# Patient Record
Sex: Female | Born: 1990 | Race: Black or African American | Hispanic: No | Marital: Single | Smoking: Never smoker
Health system: Southern US, Community
[De-identification: ages and names within clinical notes are randomized; demographics above are authoritative.]

## PROBLEM LIST (undated history)

## (undated) DIAGNOSIS — J45909 Unspecified asthma, uncomplicated: Secondary | ICD-10-CM

## (undated) HISTORY — DX: Unspecified asthma, uncomplicated: J45.909

---

## 2011-10-25 ENCOUNTER — Emergency Department (HOSPITAL_COMMUNITY)
Admission: EM | Admit: 2011-10-25 | Discharge: 2011-10-25 | Disposition: A | Discharge status: Home / Self Care | Payer: Self-pay | Attending: Emergency Medicine | Admitting: Emergency Medicine

## 2011-10-25 ENCOUNTER — Encounter (HOSPITAL_COMMUNITY): Payer: Self-pay | Admitting: *Deleted

## 2011-10-25 DIAGNOSIS — H01009 Unspecified blepharitis unspecified eye, unspecified eyelid: Secondary | ICD-10-CM | POA: Insufficient documentation

## 2011-10-25 DIAGNOSIS — H01003 Unspecified blepharitis right eye, unspecified eyelid: Secondary | ICD-10-CM

## 2011-10-25 MED ORDER — TOBRAMYCIN 0.3 % OP OINT
TOPICAL_OINTMENT | Freq: Once | OPHTHALMIC | Status: AC
  Administered 2011-10-25: 13:00:00 via OPHTHALMIC
  Filled 2011-10-25: qty 3.5

## 2011-10-25 MED ORDER — DIPHENHYDRAMINE HCL 25 MG PO TABS: 25.0000 mg | ORAL_TABLET | Freq: Four times a day (QID) | ORAL | Status: DC | PRN

## 2011-10-25 NOTE — Discharge Instructions (Signed)
Blepharitis Blepharitis is redness, soreness, and swelling (inflammation) of one or both eyelids. It may be caused by an allergic reaction or a bacterial infection. Blepharitis may also be associated with reddened, scaly skin (seborrhea) of the scalp and eyebrows. While you sleep, eye discharge may cause your eyelashes to stick together. Your eyelids may itch, burn, swell, and may lose their lashes. These will grow back. Your eyes may become sensitive. Blepharitis may recur and need repeated treatment. If this is the case, you may require further evaluation by an eye specialist (ophthalmologist). HOME CARE INSTRUCTIONS   Keep your hands clean.   Use a clean towel each time you dry your eyelids. Do not use this towel to clean other areas. Do not share a towel or makeup with anyone.   Wash your eyelids with warm water or warm water mixed with a small amount of baby shampoo. Do this twice a day or as often as needed.   Wash your face and eyebrows at least once a day.   Use warm compresses 2 times a day for 10 minutes at a time, or as directed by your caregiver.   Apply antibiotic ointment as directed by your caregiver.   Avoid rubbing your eyes.   Avoid wearing makeup until you get better.   Follow up with your caregiver as directed.  SEEK IMMEDIATE MEDICAL CARE IF:   You have pain, redness, or swelling that gets worse or spreads to other parts of your face.   Your vision changes, or you have pain when looking at lights or moving objects.   You have a fever.   Your symptoms continue for longer than 2 to 4 days or become worse.  MAKE SURE YOU:   Understand these instructions.   Will watch your condition.   Will get help right away if you are not doing well or get worse.  Document Released: 05/21/2000 Document Revised: 05/13/2011 Document Reviewed: 07/01/2010 [employer] [facility] [employer] [facility] Patient Information 2012 [place], [place].   Apply the antibiotic ointment given to you to your right eyelid 3  times daily for the next 5 days.  Use Benadryl if needed for itching and [place] hard not to rub your on a which can worsen the swelling.  Please call the ophthalmologist listed above if you continue to have symptoms beyond the next 5 days, or if your symptoms worsen.

## 2011-10-25 NOTE — ED Notes (Signed)
Pt states she woke up this am and her rt eye was swollen states had some curry that she tried new a few days ago but otherwise has had nothing else new states that she had bad allergis reaction to something a while ago but did not got to  dr . Andrey Cota that she doesn,t feel like she  Has any Fb in eye at this time eye is not red

## 2011-10-25 NOTE — ED Notes (Signed)
Patient awoke this am at approx 1100 with right eye swelling, no injury, no drainage

## 2011-10-27 NOTE — ED Provider Notes (Signed)
History     CSN: [id number]  Arrival date & time 10/25/11  1156   First MD Initiated Contact with Patient 10/25/11 1235      Chief Complaint  Patient presents with  . Eye Problem    right eye swelling    (Consider location/radiation/quality/duration/timing/severity/associated sxs/prior treatment) HPI Comments: Patient states her eyes were itchy yesterday evening prior to bed,  Especially the right eye.  She rubbed her eyes vigorously and when she woke today,  She had swelling of the eyelids of her right eye.  She denies pain,  But states she some soreness along the outer upper lid margin.  Denies foreign body sensation or visual changes,  No thick drainage,  But has had increased tearing.  She denies injury and does not wear contacts or glasses.    Patient is a 21 y.o. female presenting with eye problem. The history is provided by the patient.  Eye Problem     History reviewed. No pertinent past medical history.  History reviewed. No pertinent past surgical history.  No family history on file.  History  Substance Use Topics  . Smoking status: Not on file  . Smokeless tobacco: Not on file  . Alcohol Use: No    OB History    Grav Para Term Preterm Abortions TAB SAB Ect Mult Living                  Review of Systems  Constitutional: Negative for fever.  HENT: Negative for sore throat, facial swelling, neck pain and neck stiffness.   Eyes: Positive for itching.  Respiratory: Negative for shortness of breath.     Allergies  Review of patient's allergies indicates no known allergies.  Home Medications   Current Outpatient Rx  Name Route Sig Dispense Refill  . DIPHENHYDRAMINE HCL 25 MG PO TABS Oral Take 1 tablet (25 mg total) by mouth every 6 (six) hours as needed for itching. 20 tablet 0    BP 126/69  Pulse 77  Temp(Src) 97.9 F (36.6 C) (Oral)  Resp 20  SpO2 100%  LMP 10/25/2011  Physical Exam  Constitutional: She is oriented to person, place, and time.  She appears well-developed and well-nourished. No distress.  HENT:  Head: Normocephalic and atraumatic.  Right Ear: Tympanic membrane and external ear normal.  Left Ear: Tympanic membrane and external ear normal.  Mouth/Throat: Uvula is midline, oropharynx is clear and moist and mucous membranes are normal. No oral lesions.  Eyes: Conjunctivae and EOM are normal. Pupils are equal, round, and reactive to light. Right eye exhibits no chemosis, no discharge and no exudate. No foreign body present in the right eye. Right conjunctiva is not injected.       Acuity completed at bedside os/ou/od 20/20.  Patient has right upper lid edema and slight erythema along lateral border with increased swelling laterally.  No obvious hordeolum/punctum.  Slight scaling along eyelash edges.    Neck: Normal range of motion. Neck supple.  Cardiovascular: Normal rate and normal heart sounds.   Pulmonary/Chest: Effort normal.  Abdominal: She exhibits no distension.  Musculoskeletal: Normal range of motion.  Lymphadenopathy:    She has no cervical adenopathy.  Neurological: She is alert and oriented to person, place, and time.  Skin: Skin is warm and dry. No erythema.  Psychiatric: She has a normal mood and affect.    ED Course  Procedures (including critical care time)  Labs Reviewed - No data to display No results found.  1. Blepharitis of right eye       MDM  Possible blepharitis vs allergic reaction with traumatic swelling secondary to rubbing the lids.  Pt encouraged to avoid rubbing her eyes,  Rather use cool compresses if itchy. Also suggested washing lids to eyelid margins twice daily with baby shampoo.  Benadryl for itching,  Given tobrex ointment for periorbital use tid x 1 week.  Referral to Dr. Gwen Pounds for a recheck if not improved over the next week or if sx worsen.         Burgess Amor, PA 10/27/11 1246  Burgess Amor, PA 10/27/11 1247  Burgess Amor, PA 10/27/11 1247

## 2011-10-28 NOTE — ED Provider Notes (Signed)
Medical screening examination/treatment/procedure(s) were performed by non-physician practitioner and as supervising physician I was immediately available for consultation/collaboration.   Zuley Lutter, MD 10/28/11 1336 

## 2012-06-07 NOTE — L&D Delivery Note (Signed)
Delivery Note Progressed to complete dilation and pushed well.  At 11:06 PM a viable and healthy female was delivered via Vaginal, Spontaneous Delivery en caul  (Presentation:OA with compound hand presentation ).  No difficulty with shoulders  APGAR:9/9;  weight .   Placenta status: spontaneous and grossly intact with 3V Cord:  with the following complications: short cord .   Anesthesia:  None  Episiotomy: None Lacerations: Bilateral labial splits,not bleeding, patient requested no repair, both hemostatic Suture Repair: none Est. Blood Loss (mL): 300  Mom to postpartum.  Baby to with mother.  [facility] 12/17/2012, 11:34 PM

## 2012-08-23 ENCOUNTER — Other Ambulatory Visit: Payer: Self-pay

## 2012-08-23 LAB — HIV ANTIBODY (ROUTINE TESTING W REFLEX): HIV: NONREACTIVE

## 2012-08-23 NOTE — Addendum Note (Signed)
Addended by: Jill Side on: 08/23/2012 09:50 AM   Modules accepted: Orders

## 2012-08-24 LAB — OBSTETRIC PANEL
Antibody Screen: NEGATIVE
Eosinophils Absolute: 0.1 10*3/uL (ref 0.0–0.7)
Eosinophils Relative: 1 % (ref 0–5)
HCT: 29.5 % — ABNORMAL LOW (ref 36.0–46.0)
Lymphs Abs: 1.3 10*3/uL (ref 0.7–4.0)
MCH: 29.5 pg (ref 26.0–34.0)
MCV: 84.5 fL (ref 78.0–100.0)
Monocytes Absolute: 0.6 10*3/uL (ref 0.1–1.0)
Platelets: 243 10*3/uL (ref 150–400)
RBC: 3.49 MIL/uL — ABNORMAL LOW (ref 3.87–5.11)
RDW: 14.8 % (ref 11.5–15.5)

## 2012-08-25 LAB — HEMOGLOBINOPATHY EVALUATION
Hgb A: 59 % — ABNORMAL LOW (ref 96.8–97.8)
Hgb S Quant: 0 %

## 2012-08-28 ENCOUNTER — Encounter: Payer: Self-pay | Admitting: Family Medicine

## 2012-08-28 DIAGNOSIS — D582 Other hemoglobinopathies: Secondary | ICD-10-CM | POA: Insufficient documentation

## 2012-08-28 LAB — HGB ELECTROPHORESIS REFLEXED REPORT
Hemoglobin A2 - HGBRFX: 3.1 % (ref 1.8–3.5)
Hemoglobin Elect C: 41.2 % — ABNORMAL HIGH
Hemoglobin F - HGBRFX: 0 % (ref <2.0)
Sickle Solubility Test - HGBRFX: NEGATIVE

## 2012-08-29 ENCOUNTER — Ambulatory Visit (HOSPITAL_COMMUNITY)
Admission: RE | Admit: 2012-08-29 | Discharge: 2012-08-29 | Disposition: A | Discharge status: Home / Self Care | Payer: Medicaid Other | Source: Ambulatory Visit | Attending: Family Medicine | Admitting: Family Medicine

## 2012-08-29 ENCOUNTER — Encounter (HOSPITAL_COMMUNITY): Payer: Self-pay

## 2012-08-29 DIAGNOSIS — Z3689 Encounter for other specified antenatal screening: Secondary | ICD-10-CM | POA: Insufficient documentation

## 2012-08-29 DIAGNOSIS — Z3490 Encounter for supervision of normal pregnancy, unspecified, unspecified trimester: Secondary | ICD-10-CM

## 2012-08-29 DIAGNOSIS — O0930 Supervision of pregnancy with insufficient antenatal care, unspecified trimester: Secondary | ICD-10-CM | POA: Insufficient documentation

## 2012-08-30 ENCOUNTER — Encounter: Payer: Self-pay | Admitting: *Deleted

## 2012-08-30 ENCOUNTER — Telehealth: Payer: Self-pay | Admitting: *Deleted

## 2012-08-30 NOTE — Telephone Encounter (Signed)
Pt called nurse callback line again and stated what is below. Called pt and pt wanted to know if she would see the provider when before her appt.  I informed pt that the reason why we have sheduled an [place] before appt so that when she does see the provider on her appt time that the provider will know how to direct care.  I informed her that she would receive a new ob packet that gives her important pregnancy info.  Pt stated understanding.  Pt also wanted [place] to fax a proof of pregnancy to [facility] @ [phone] so that she could get WIC.  I let pt know that we would send proof of pregnancy to [place] to day.  Pt stated "ok" and did not have any other questions.

## 2012-08-30 NOTE — Telephone Encounter (Signed)
Pt left message stating that she had [place] and has questions.  I called pt and left message that I am calling to answer her questions. Please call back and leave new message stating her questions and whether or not it is ok to leave detailed responses on her voice mail.

## 2012-09-27 ENCOUNTER — Other Ambulatory Visit (HOSPITAL_COMMUNITY)
Admission: RE | Admit: 2012-09-27 | Discharge: 2012-09-27 | Disposition: A | Discharge status: Home / Self Care | Payer: Medicaid Other | Source: Ambulatory Visit | Attending: Obstetrics & Gynecology | Admitting: Obstetrics & Gynecology

## 2012-09-27 ENCOUNTER — Encounter: Payer: Self-pay | Admitting: Advanced Practice Midwife

## 2012-09-27 ENCOUNTER — Other Ambulatory Visit: Payer: Self-pay | Admitting: Obstetrics & Gynecology

## 2012-09-27 ENCOUNTER — Ambulatory Visit (INDEPENDENT_AMBULATORY_CARE_PROVIDER_SITE_OTHER): Payer: Medicaid Other | Admitting: Advanced Practice Midwife

## 2012-09-27 VITALS — BP 117/73 | Temp 97.0°F | Ht 60.0 in | Wt 130.2 lb

## 2012-09-27 DIAGNOSIS — D573 Sickle-cell trait: Secondary | ICD-10-CM

## 2012-09-27 DIAGNOSIS — Z113 Encounter for screening for infections with a predominantly sexual mode of transmission: Secondary | ICD-10-CM | POA: Insufficient documentation

## 2012-09-27 DIAGNOSIS — Z01419 Encounter for gynecological examination (general) (routine) without abnormal findings: Secondary | ICD-10-CM | POA: Insufficient documentation

## 2012-09-27 DIAGNOSIS — Z3403 Encounter for supervision of normal first pregnancy, third trimester: Secondary | ICD-10-CM

## 2012-09-27 DIAGNOSIS — Z3400 Encounter for supervision of normal first pregnancy, unspecified trimester: Secondary | ICD-10-CM

## 2012-09-27 LAB — CBC
MCHC: 34.9 g/dL (ref 30.0–36.0)
Platelets: 213 10*3/uL (ref 150–400)
RDW: 15.6 % — ABNORMAL HIGH (ref 11.5–15.5)
WBC: 7.6 10*3/uL (ref 4.0–10.5)

## 2012-09-27 LAB — POCT URINALYSIS DIP (DEVICE)
Ketones, ur: NEGATIVE mg/dL
Nitrite: NEGATIVE
Protein, ur: NEGATIVE mg/dL
Urobilinogen, UA: 0.2 mg/dL (ref 0.0–1.0)
pH: 6.5 (ref 5.0–8.0)

## 2012-09-27 NOTE — Progress Notes (Signed)
I saw and examined patient along with student and agree with above note.   Nancy Moran 09/27/2012 10:42 AM

## 2012-09-27 NOTE — Progress Notes (Signed)
   Subjective:    Nancy Moran is a G1P0 [redacted]w[redacted]d being seen today for her first obstetrical visit.  Her obstetrical history is significant for none. Patient does intend to breast feed. Pregnancy history fully reviewed.  Patient reports bump in rectal area, that is not painful or bleeding. States does not bother her to the point of having it checked at this time.Ceasar Mons Vitals:   09/27/12 4098 09/27/12 0838  BP: 117/73   Temp: 97 F (36.1 C)   Height:  5' (1.524 m)  Weight: 130 lb 3.2 oz (59.058 kg)     HISTORY: OB History   Grav Para Term Preterm Abortions TAB SAB Ect Mult Living   1              # Outc Date GA Lbr Len/2nd Wgt Sex Del Anes PTL Lv   1 CUR              Past Medical History  Diagnosis Date  . Asthma    History reviewed. No pertinent past surgical history. Family History  Problem Relation Age of Onset  . Hypertension Mother   . Hypertension Sister      Exam    Uterus:     Pelvic Exam:    Perineum: Normal Perineum   Vulva: normal   Vagina:  normal mucosa   pH:    Cervix: no cervical motion tenderness and no lesions   Adnexa: not evaluated   Bony Pelvis: average  System: Breast:  not examined   Skin: normal coloration and turgor, no rashes    Neurologic: normal   Extremities: no deformities, no musculoskeletal defects noted   HEENT extra ocular movement intact and sclera clear, anicteric   Mouth/Teeth mucous membranes moist, pharynx normal without lesions   Neck supple   Cardiovascular: regular rate and rhythm   Respiratory:  appears well, vitals normal, no respiratory distress, acyanotic, normal RR, chest clear, no wheezing, crepitations, rhonchi, normal symmetric air entry   Abdomen: soft, non-tender; bowel sounds normal; no masses,  no organomegaly   Urinary: bladder not palpable      Assessment:    Pregnancy: G1P0 Patient Active Problem List  Diagnosis  . Hemoglobin C trait        Plan:     Initial labs previously done. GC/Chlam  and PAP smear done. Prenatal vitamins. Problem list reviewed and updated. Genetic Screening previously done, quad screen negative  Ultrasound previously done with no abnormalities.  Follow up in 2 weeks. 50% of 40 min visit spent on counseling and coordination of care.     Nancy Moran 09/27/2012

## 2012-09-27 NOTE — Patient Instructions (Addendum)
Pregnancy - Third Trimester  The third trimester of pregnancy (the last 3 months) is a period of the most rapid growth for you and your baby. The baby approaches a length of 20 inches and a weight of 6 to 10 pounds. The baby is adding on fat and getting ready for life outside your body. While inside, babies have periods of sleeping and waking, suck their thumbs, and hiccups. You can often feel small contractions of the uterus. This is false labor. It is also called Braxton-Hicks contractions. This is like a practice for labor. The usual problems in this stage of pregnancy include more difficulty breathing, swelling of the hands and feet from water retention, and having to urinate more often because of the uterus and baby pressing on your bladder.   PRENATAL EXAMS  · Blood work may continue to be done during prenatal exams. These tests are done to check on your health and the probable health of your baby. Blood work is used to follow your blood levels (hemoglobin). Anemia (low hemoglobin) is common during pregnancy. Iron and vitamins are given to help prevent this. You may also continue to be checked for diabetes. Some of the past blood tests may be done again.  · The size of the uterus is measured during each visit. This makes sure your baby is growing properly according to your pregnancy dates.  · Your blood pressure is checked every prenatal visit. This is to make sure you are not getting toxemia.  · Your urine is checked every prenatal visit for infection, diabetes and protein.  · Your weight is checked at each visit. This is done to make sure gains are happening at the suggested rate and that you and your baby are growing normally.  · Sometimes, an ultrasound is performed to confirm the position and the proper growth and development of the baby. This is a test done that bounces harmless sound waves off the baby so your caregiver can more accurately determine due dates.  · Discuss the type of pain medication and  anesthesia you will have during your labor and delivery.  · Discuss the possibility and anesthesia if a Cesarean Section might be necessary.  · Inform your caregiver if there is any mental or physical violence at home.  Sometimes, a specialized non-stress test, contraction stress test and biophysical profile are done to make sure the baby is not having a problem. Checking the amniotic fluid surrounding the baby is called an amniocentesis. The amniotic fluid is removed by sticking a needle into the belly (abdomen). This is sometimes done near the end of pregnancy if an early delivery is required. In this case, it is done to help make sure the baby's lungs are mature enough for the baby to live outside of the womb. If the lungs are not mature and it is unsafe to deliver the baby, an injection of cortisone medication is given to the mother 1 to 2 days before the delivery. This helps the baby's lungs mature and makes it safer to deliver the baby.  CHANGES OCCURING IN THE THIRD TRIMESTER OF PREGNANCY  Your body goes through many changes during pregnancy. They vary from person to person. Talk to your caregiver about changes you notice and are concerned about.  · During the last trimester, you have probably had an increase in your appetite. It is normal to have cravings for certain foods. This varies from person to person and pregnancy to pregnancy.  · You may begin to   get stretch marks on your hips, abdomen, and breasts. These are normal changes in the body during pregnancy. There are no exercises or medications to take which prevent this change.  · Constipation may be treated with a stool softener or adding bulk to your diet. Drinking lots of fluids, fiber in vegetables, fruits, and whole grains are helpful.  · Exercising is also helpful. If you have been very active up until your pregnancy, most of these activities can be continued during your pregnancy. If you have been less active, it is helpful to start an exercise  program such as walking. Consult your caregiver before starting exercise programs.  · Avoid all smoking, alcohol, un-prescribed drugs, herbs and "street drugs" during your pregnancy. These chemicals affect the formation and growth of the baby. Avoid chemicals throughout the pregnancy to ensure the delivery of a healthy infant.  · Backache, varicose veins and hemorrhoids may develop or get worse.  · You will tire more easily in the third trimester, which is normal.  · The baby's movements may be stronger and more often.  · You may become short of breath easily.  · Your belly button may stick out.  · A yellow discharge may leak from your breasts called colostrum.  · You may have a bloody mucus discharge. This usually occurs a few days to a week before labor begins.  HOME CARE INSTRUCTIONS   · Keep your caregiver's appointments. Follow your caregiver's instructions regarding medication use, exercise, and diet.  · During pregnancy, you are providing food for you and your baby. Continue to eat regular, well-balanced meals. Choose foods such as meat, fish, milk and other low fat dairy products, vegetables, fruits, and whole-grain breads and cereals. Your caregiver will tell you of the ideal weight gain.  · A physical sexual relationship may be continued throughout pregnancy if there are no other problems such as early (premature) leaking of amniotic fluid from the membranes, vaginal bleeding, or belly (abdominal) pain.  · Exercise regularly if there are no restrictions. Check with your caregiver if you are unsure of the safety of your exercises. Greater weight gain will occur in the last 2 trimesters of pregnancy. Exercising helps:  · Control your weight.  · Get you in shape for labor and delivery.  · You lose weight after you deliver.  · Rest a lot with legs elevated, or as needed for leg cramps or low back pain.  · Wear a good support or jogging bra for breast tenderness during pregnancy. This may help if worn during  sleep. Pads or tissues may be used in the bra if you are leaking colostrum.  · Do not use hot tubs, steam rooms, or saunas.  · Wear your seat belt when driving. This protects you and your baby if you are in an accident.  · Avoid raw meat, cat litter boxes and soil used by cats. These carry germs that can cause birth defects in the baby.  · It is easier to loose urine during pregnancy. Tightening up and strengthening the pelvic muscles will help with this problem. You can practice stopping your urination while you are going to the bathroom. These are the same muscles you need to strengthen. It is also the muscles you would use if you were trying to stop from passing gas. You can practice tightening these muscles up 10 times a set and repeating this about 3 times per day. Once you know what muscles to tighten up, do not perform these   exercises during urination. It is more likely to cause an infection by backing up the urine.  · Ask for help if you have financial, counseling or nutritional needs during pregnancy. Your caregiver will be able to offer counseling for these needs as well as refer you for other special needs.  · Make a list of emergency phone numbers and have them available.  · Plan on getting help from family or friends when you go home from the hospital.  · Make a trial run to the hospital.  · Take prenatal classes with the father to understand, practice and ask questions about the labor and delivery.  · Prepare the baby's room/nursery.  · Do not travel out of the city unless it is absolutely necessary and with the advice of your caregiver.  · Wear only low or no heal shoes to have better balance and prevent falling.  MEDICATIONS AND DRUG USE IN PREGNANCY  · Take prenatal vitamins as directed. The vitamin should contain 1 milligram of folic acid. Keep all vitamins out of reach of children. Only a couple vitamins or tablets containing iron may be fatal to a baby or young child when ingested.  · Avoid use  of all medications, including herbs, over-the-counter medications, not prescribed or suggested by your caregiver. Only take over-the-counter or prescription medicines for pain, discomfort, or fever as directed by your caregiver. Do not use aspirin, ibuprofen (Motrin®, Advil®, Nuprin®) or naproxen (Aleve®) unless OK'd by your caregiver.  · Let your caregiver also know about herbs you may be using.  · Alcohol is related to a number of birth defects. This includes fetal alcohol syndrome. All alcohol, in any form, should be avoided completely. Smoking will cause low birth rate and premature babies.  · Street/illegal drugs are very harmful to the baby. They are absolutely forbidden. A baby born to an addicted mother will be addicted at birth. The baby will go through the same withdrawal an adult does.  SEEK MEDICAL CARE IF:  You have any concerns or worries during your pregnancy. It is better to call with your questions if you feel they cannot wait, rather than worry about them.  DECISIONS ABOUT CIRCUMCISION  You may or may not know the sex of your baby. If you know your baby is a boy, it may be time to think about circumcision. Circumcision is the removal of the foreskin of the penis. This is the skin that covers the sensitive end of the penis. There is no proven medical need for this. Often this decision is made on what is popular at the time or based upon religious beliefs and social issues. You can discuss these issues with your caregiver or pediatrician.  SEEK IMMEDIATE MEDICAL CARE IF:   · An unexplained oral temperature above 102° F (38.9° C) develops, or as your caregiver suggests.  · You have leaking of fluid from the vagina (birth canal). If leaking membranes are suspected, take your temperature and tell your caregiver of this when you call.  · There is vaginal spotting, bleeding or passing clots. Tell your caregiver of the amount and how many pads are used.  · You develop a bad smelling vaginal discharge with  a change in the color from clear to white.  · You develop vomiting that lasts more than 24 hours.  · You develop chills or fever.  · You develop shortness of breath.  · You develop burning on urination.  · You loose more than 2 pounds of weight   or gain more than 2 pounds of weight or as suggested by your caregiver.  · You notice sudden swelling of your face, hands, and feet or legs.  · You develop belly (abdominal) pain. Round ligament discomfort is a common non-cancerous (benign) cause of abdominal pain in pregnancy. Your caregiver still must evaluate you.  · You develop a severe headache that does not go away.  · You develop visual problems, blurred or double vision.  · If you have not felt your baby move for more than 1 hour. If you think the baby is not moving as much as usual, eat something with sugar in it and lie down on your left side for an hour. The baby should move at least 4 to 5 times per hour. Call right away if your baby moves less than that.  · You fall, are in a car accident or any kind of trauma.  · There is mental or physical violence at home.  Document Released: 05/18/2001 Document Revised: 08/16/2011 Document Reviewed: 11/20/2008  [employer]® Patient Information ©2013 [employer].

## 2012-09-28 LAB — HIV ANTIBODY (ROUTINE TESTING W REFLEX): HIV: NONREACTIVE

## 2012-09-28 LAB — GLUCOSE TOLERANCE, 1 HOUR (50G) W/O FASTING: Glucose, 1 Hour GTT: 54 mg/dL — ABNORMAL LOW (ref 70–140)

## 2012-09-29 ENCOUNTER — Encounter: Payer: Self-pay | Admitting: Advanced Practice Midwife

## 2012-09-29 DIAGNOSIS — O0930 Supervision of pregnancy with insufficient antenatal care, unspecified trimester: Secondary | ICD-10-CM | POA: Insufficient documentation

## 2012-10-11 ENCOUNTER — Other Ambulatory Visit: Payer: Self-pay | Admitting: Family Medicine

## 2012-10-11 ENCOUNTER — Ambulatory Visit (INDEPENDENT_AMBULATORY_CARE_PROVIDER_SITE_OTHER): Payer: Medicaid Other | Admitting: Family Medicine

## 2012-10-11 VITALS — BP 117/79 | Wt 133.2 lb

## 2012-10-11 DIAGNOSIS — Z3403 Encounter for supervision of normal first pregnancy, third trimester: Secondary | ICD-10-CM

## 2012-10-11 DIAGNOSIS — R82998 Other abnormal findings in urine: Secondary | ICD-10-CM

## 2012-10-11 DIAGNOSIS — R8281 Pyuria: Secondary | ICD-10-CM

## 2012-10-11 DIAGNOSIS — Z3400 Encounter for supervision of normal first pregnancy, unspecified trimester: Secondary | ICD-10-CM

## 2012-10-11 LAB — POCT URINALYSIS DIP (DEVICE)
Protein, ur: 30 mg/dL — AB
Urobilinogen, UA: 2 mg/dL — ABNORMAL HIGH (ref 0.0–1.0)
pH: 7 (ref 5.0–8.0)

## 2012-10-11 NOTE — Progress Notes (Signed)
Pulse 111  

## 2012-10-11 NOTE — Patient Instructions (Addendum)
Normal Labor and Delivery Your caregiver must first be sure you are in labor. Signs of labor include:  You may pass what is called "the mucus plug" before labor begins. This is a small amount of blood stained mucus.  Regular uterine contractions.  The time between contractions get closer together.  The discomfort and pain gradually gets more intense.  Pains are mostly located in the back.  Pains get worse when walking.  The cervix (the opening of the uterus becomes thinner (begins to efface) and opens up (dilates). Once you are in labor and admitted into the hospital or care center, your caregiver will do the following:  A complete physical examination.  Check your vital signs (blood pressure, pulse, temperature and the fetal heart rate).  Do a vaginal examination (using a sterile glove and lubricant) to determine:  The position (presentation) of the baby (head [vertex] or buttock first).  The level (station) of the baby's head in the birth canal.  The effacement and dilatation of the cervix.  You may have your pubic hair shaved and be given an enema depending on your caregiver and the circumstance.  An electronic monitor is usually placed on your abdomen. The monitor follows the length and intensity of the contractions, as well as the baby's heart rate.  Usually, your caregiver will insert an IV in your arm with a bottle of sugar water. This is done as a precaution so that medications can be given to you quickly during labor or delivery. NORMAL LABOR AND DELIVERY IS DIVIDED UP INTO 3 STAGES: First Stage This is when regular contractions begin and the cervix begins to efface and dilate. This stage can last from 3 to 15 hours. The end of the first stage is when the cervix is 100% effaced and 10 centimeters dilated. Pain medications may be given by   Injection (morphine, demerol, etc.)  Regional anesthesia (spinal, caudal or epidural, anesthetics given in different locations of  the spine). Paracervical pain medication may be given, which is an injection of and anesthetic on each side of the cervix. A pregnant woman may request to have "Natural Childbirth" which is not to have any medications or anesthesia during her labor and delivery. Second Stage This is when the baby comes down through the birth canal (vagina) and is born. This can take 1 to 4 hours. As the baby's head comes down through the birth canal, you may feel like you are going to have a bowel movement. You will get the urge to bear down and push until the baby is delivered. As the baby's head is being delivered, the caregiver will decide if an episiotomy (a cut in the perineum and vagina area) is needed to prevent tearing of the tissue in this area. The episiotomy is sewn up after the delivery of the baby and placenta. Sometimes a mask with nitrous oxide is given for the mother to breath during the delivery of the baby to help if there is too much pain. The end of Stage 2 is when the baby is fully delivered. Then when the umbilical cord stops pulsating it is clamped and cut. Third Stage The third stage begins after the baby is completely delivered and ends after the placenta (afterbirth) is delivered. This usually takes 5 to 30 minutes. After the placenta is delivered, a medication is given either by intravenous or injection to help contract the uterus and prevent bleeding. The third stage is not painful and pain medication is usually not necessary.   If an episiotomy was done, it is repaired at this time. After the delivery, the mother is watched and monitored closely for 1 to 2 hours to make sure there is no postpartum bleeding (hemorrhage). If there is a lot of bleeding, medication is given to contract the uterus and stop the bleeding. Document Released: 03/02/2008 Document Revised: 08/16/2011 Document Reviewed: 03/02/2008 [employer] Patient Information 2013 [employer].   Pregnancy - Third Trimester The third  trimester of pregnancy (the last 3 months) is a period of the most rapid growth for you and your baby. The baby approaches a length of 20 inches and a weight of 6 to 10 pounds. The baby is adding on fat and getting ready for life outside your body. While inside, babies have periods of sleeping and waking, suck their thumbs, and hiccups. You can often feel small contractions of the uterus. This is false labor. It is also called Braxton-Hicks contractions. This is like a practice for labor. The usual problems in this stage of pregnancy include more difficulty breathing, swelling of the hands and feet from water retention, and having to urinate more often because of the uterus and baby pressing on your bladder.  PRENATAL EXAMS  Blood work may continue to be done during prenatal exams. These tests are done to check on your health and the probable health of your baby. Blood work is used to follow your blood levels (hemoglobin). Anemia (low hemoglobin) is common during pregnancy. Iron and vitamins are given to help prevent this. You may also continue to be checked for diabetes. Some of the past blood tests may be done again.  The size of the uterus is measured during each visit. This makes sure your baby is growing properly according to your pregnancy dates.  Your blood pressure is checked every prenatal visit. This is to make sure you are not getting toxemia.  Your urine is checked every prenatal visit for infection, diabetes and protein.  Your weight is checked at each visit. This is done to make sure gains are happening at the suggested rate and that you and your baby are growing normally.  Sometimes, an ultrasound is performed to confirm the position and the proper growth and development of the baby. This is a test done that bounces harmless sound waves off the baby so your caregiver can more accurately determine due dates.  Discuss the type of pain medication and anesthesia you will have during your  labor and delivery.  Discuss the possibility and anesthesia if a Cesarean Section might be necessary.  Inform your caregiver if there is any mental or physical violence at home. Sometimes, a specialized non-stress test, contraction stress test and biophysical profile are done to make sure the baby is not having a problem. Checking the amniotic fluid surrounding the baby is called an amniocentesis. The amniotic fluid is removed by sticking a needle into the belly (abdomen). This is sometimes done near the end of pregnancy if an early delivery is required. In this case, it is done to help make sure the baby's lungs are mature enough for the baby to live outside of the womb. If the lungs are not mature and it is unsafe to deliver the baby, an injection of cortisone medication is given to the mother 1 to 2 days before the delivery. This helps the baby's lungs mature and makes it safer to deliver the baby. CHANGES OCCURING IN THE THIRD TRIMESTER OF PREGNANCY Your body goes through many changes during pregnancy. They   vary from person to person. Talk to your caregiver about changes you notice and are concerned about.  During the last trimester, you have probably had an increase in your appetite. It is normal to have cravings for certain foods. This varies from person to person and pregnancy to pregnancy.  You may begin to get stretch marks on your hips, abdomen, and breasts. These are normal changes in the body during pregnancy. There are no exercises or medications to take which prevent this change.  Constipation may be treated with a stool softener or adding bulk to your diet. Drinking lots of fluids, fiber in vegetables, fruits, and whole grains are helpful.  Exercising is also helpful. If you have been very active up until your pregnancy, most of these activities can be continued during your pregnancy. If you have been less active, it is helpful to start an exercise program such as walking. Consult your  caregiver before starting exercise programs.  Avoid all smoking, alcohol, un-prescribed drugs, herbs and "street drugs" during your pregnancy. These chemicals affect the formation and growth of the baby. Avoid chemicals throughout the pregnancy to ensure the delivery of a healthy infant.  Backache, varicose veins and hemorrhoids may develop or get worse.  You will tire more easily in the third trimester, which is normal.  The baby's movements may be stronger and more often.  You may become short of breath easily.  Your belly button may stick out.  A yellow discharge may leak from your breasts called colostrum.  You may have a bloody mucus discharge. This usually occurs a few days to a week before labor begins. HOME CARE INSTRUCTIONS   Keep your caregiver's appointments. Follow your caregiver's instructions regarding medication use, exercise, and diet.  During pregnancy, you are providing food for you and your baby. Continue to eat regular, well-balanced meals. Choose foods such as meat, fish, milk and other low fat dairy products, vegetables, fruits, and whole-grain breads and cereals. Your caregiver will tell you of the ideal weight gain.  A physical sexual relationship may be continued throughout pregnancy if there are no other problems such as early (premature) leaking of amniotic fluid from the membranes, vaginal bleeding, or belly (abdominal) pain.  Exercise regularly if there are no restrictions. Check with your caregiver if you are unsure of the safety of your exercises. Greater weight gain will occur in the last 2 trimesters of pregnancy. Exercising helps:  Control your weight.  Get you in shape for labor and delivery.  You lose weight after you deliver.  Rest a lot with legs elevated, or as needed for leg cramps or low back pain.  Wear a good support or jogging bra for breast tenderness during pregnancy. This may help if worn during sleep. Pads or tissues may be used in  the bra if you are leaking colostrum.  Do not use hot tubs, steam rooms, or saunas.  Wear your seat belt when driving. This protects you and your baby if you are in an accident.  Avoid raw meat, cat litter boxes and soil used by cats. These carry germs that can cause birth defects in the baby.  It is easier to loose urine during pregnancy. Tightening up and strengthening the pelvic muscles will help with this problem. You can practice stopping your urination while you are going to the bathroom. These are the same muscles you need to strengthen. It is also the muscles you would use if you were trying to stop from passing gas.   You can practice tightening these muscles up 10 times a set and repeating this about 3 times per day. Once you know what muscles to tighten up, do not perform these exercises during urination. It is more likely to cause an infection by backing up the urine.  Ask for help if you have financial, counseling or nutritional needs during pregnancy. Your caregiver will be able to offer counseling for these needs as well as refer you for other special needs.  Make a list of emergency phone numbers and have them available.  Plan on getting help from family or friends when you go home from the hospital.  Make a trial run to the hospital.  Take prenatal classes with the father to understand, practice and ask questions about the labor and delivery.  Prepare the baby's room/nursery.  Do not travel out of the city unless it is absolutely necessary and with the advice of your caregiver.  Wear only low or no heal shoes to have better balance and prevent falling. MEDICATIONS AND DRUG USE IN PREGNANCY  Take prenatal vitamins as directed. The vitamin should contain 1 milligram of folic acid. Keep all vitamins out of reach of children. Only a couple vitamins or tablets containing iron may be fatal to a baby or young child when ingested.  Avoid use of all medications, including herbs,  over-the-counter medications, not prescribed or suggested by your caregiver. Only take over-the-counter or prescription medicines for pain, discomfort, or fever as directed by your caregiver. Do not use aspirin, ibuprofen (Motrin, Advil, Nuprin) or naproxen (Aleve) unless OK'd by your caregiver.  Let your caregiver also know about herbs you may be using.  Alcohol is related to a number of birth defects. This includes fetal alcohol syndrome. All alcohol, in any form, should be avoided completely. Smoking will cause low birth rate and premature babies.  Street/illegal drugs are very harmful to the baby. They are absolutely forbidden. A baby born to an addicted mother will be addicted at birth. The baby will go through the same withdrawal an adult does. SEEK MEDICAL CARE IF: You have any concerns or worries during your pregnancy. It is better to call with your questions if you feel they cannot wait, rather than worry about them. DECISIONS ABOUT CIRCUMCISION You may or may not know the sex of your baby. If you know your baby is a boy, it may be time to think about circumcision. Circumcision is the removal of the foreskin of the penis. This is the skin that covers the sensitive end of the penis. There is no proven medical need for this. Often this decision is made on what is popular at the time or based upon religious beliefs and social issues. You can discuss these issues with your caregiver or pediatrician. SEEK IMMEDIATE MEDICAL CARE IF:   An unexplained oral temperature above 102 F (38.9 C) develops, or as your caregiver suggests.  You have leaking of fluid from the vagina (birth canal). If leaking membranes are suspected, take your temperature and tell your caregiver of this when you call.  There is vaginal spotting, bleeding or passing clots. Tell your caregiver of the amount and how many pads are used.  You develop a bad smelling vaginal discharge with a change in the color from clear to  white.  You develop vomiting that lasts more than 24 hours.  You develop chills or fever.  You develop shortness of breath.  You develop burning on urination.  You loose more than 2   pounds of weight or gain more than 2 pounds of weight or as suggested by your caregiver.  You notice sudden swelling of your face, hands, and feet or legs.  You develop belly (abdominal) pain. Round ligament discomfort is a common non-cancerous (benign) cause of abdominal pain in pregnancy. Your caregiver still must evaluate you.  You develop a severe headache that does not go away.  You develop visual problems, blurred or double vision.  If you have not felt your baby move for more than 1 hour. If you think the baby is not moving as much as usual, eat something with sugar in it and lie down on your left side for an hour. The baby should move at least 4 to 5 times per hour. Call right away if your baby moves less than that.  You fall, are in a car accident or any kind of trauma.  There is mental or physical violence at home. Document Released: 05/18/2001 Document Revised: 08/16/2011 Document Reviewed: 11/20/2008 [employer] Patient Information 2013 [employer].  

## 2012-10-11 NOTE — Progress Notes (Signed)
No complaints. Occasional heartburn.

## 2012-10-12 LAB — CULTURE, OB URINE: Colony Count: NO GROWTH

## 2012-10-25 ENCOUNTER — Encounter: Payer: Self-pay | Admitting: Obstetrics & Gynecology

## 2012-10-25 ENCOUNTER — Ambulatory Visit (INDEPENDENT_AMBULATORY_CARE_PROVIDER_SITE_OTHER): Payer: Medicaid Other | Admitting: Obstetrics & Gynecology

## 2012-10-25 VITALS — BP 117/70 | Temp 97.1°F | Wt 134.6 lb

## 2012-10-25 DIAGNOSIS — Z3403 Encounter for supervision of normal first pregnancy, third trimester: Secondary | ICD-10-CM

## 2012-10-25 DIAGNOSIS — D582 Other hemoglobinopathies: Secondary | ICD-10-CM

## 2012-10-25 DIAGNOSIS — O0930 Supervision of pregnancy with insufficient antenatal care, unspecified trimester: Secondary | ICD-10-CM

## 2012-10-25 LAB — POCT URINALYSIS DIP (DEVICE)
Bilirubin Urine: NEGATIVE
Ketones, ur: NEGATIVE mg/dL
Nitrite: NEGATIVE
Protein, ur: 30 mg/dL — AB
pH: 7 (ref 5.0–8.0)

## 2012-10-25 NOTE — Progress Notes (Signed)
Routine visit. Good FM. No OB problems. "Nancy Moran". She plans to breast feed. She declines birth control.

## 2012-10-25 NOTE — Progress Notes (Signed)
Pulse: 103

## 2012-11-07 ENCOUNTER — Encounter: Payer: Self-pay | Admitting: *Deleted

## 2012-11-08 ENCOUNTER — Ambulatory Visit (INDEPENDENT_AMBULATORY_CARE_PROVIDER_SITE_OTHER): Payer: Medicaid Other | Admitting: Advanced Practice Midwife

## 2012-11-08 VITALS — BP 113/68 | Temp 97.4°F | Wt 135.0 lb

## 2012-11-08 DIAGNOSIS — D573 Sickle-cell trait: Secondary | ICD-10-CM

## 2012-11-08 DIAGNOSIS — O0930 Supervision of pregnancy with insufficient antenatal care, unspecified trimester: Secondary | ICD-10-CM

## 2012-11-08 DIAGNOSIS — Z3493 Encounter for supervision of normal pregnancy, unspecified, third trimester: Secondary | ICD-10-CM

## 2012-11-08 LAB — OB RESULTS CONSOLE GC/CHLAMYDIA
Chlamydia: NEGATIVE
Gonorrhea: NEGATIVE

## 2012-11-08 LAB — OB RESULTS CONSOLE GBS: GBS: NEGATIVE

## 2012-11-08 LAB — POCT URINALYSIS DIP (DEVICE)
Ketones, ur: NEGATIVE mg/dL
Protein, ur: NEGATIVE mg/dL
Specific Gravity, Urine: 1.015 (ref 1.005–1.030)
Urobilinogen, UA: 0.2 mg/dL (ref 0.0–1.0)
pH: 7 (ref 5.0–8.0)

## 2012-11-08 NOTE — Addendum Note (Signed)
Addended by: Franchot Mimes on: 11/08/2012 11:32 AM   Modules accepted: Orders

## 2012-11-08 NOTE — Progress Notes (Signed)
Pulse- 80 

## 2012-11-08 NOTE — Progress Notes (Signed)
No complaints today. She thinks she has a hemorrhoid, but it is not painful at this time. GBS collected today.

## 2012-11-08 NOTE — Addendum Note (Signed)
Addended by: Franchot Mimes on: 11/08/2012 12:24 PM   Modules accepted: Orders

## 2012-11-15 ENCOUNTER — Ambulatory Visit (INDEPENDENT_AMBULATORY_CARE_PROVIDER_SITE_OTHER): Payer: Medicaid Other | Admitting: Advanced Practice Midwife

## 2012-11-15 VITALS — BP 125/69 | Temp 97.8°F | Wt 136.8 lb

## 2012-11-15 DIAGNOSIS — O0930 Supervision of pregnancy with insufficient antenatal care, unspecified trimester: Secondary | ICD-10-CM

## 2012-11-15 DIAGNOSIS — O0933 Supervision of pregnancy with insufficient antenatal care, third trimester: Secondary | ICD-10-CM

## 2012-11-15 LAB — POCT URINALYSIS DIP (DEVICE)
Bilirubin Urine: NEGATIVE
Glucose, UA: NEGATIVE mg/dL
Ketones, ur: NEGATIVE mg/dL
Nitrite: NEGATIVE

## 2012-11-15 NOTE — Progress Notes (Signed)
P=91, 

## 2012-11-15 NOTE — Progress Notes (Signed)
No complaints. Feeling well. S<D today. Will scheduled ultrasound.

## 2012-11-16 ENCOUNTER — Ambulatory Visit (HOSPITAL_COMMUNITY)
Admission: RE | Admit: 2012-11-16 | Discharge: 2012-11-16 | Disposition: A | Discharge status: Home / Self Care | Payer: Medicaid Other | Source: Ambulatory Visit | Attending: Advanced Practice Midwife | Admitting: Advanced Practice Midwife

## 2012-11-16 DIAGNOSIS — Z3689 Encounter for other specified antenatal screening: Secondary | ICD-10-CM | POA: Insufficient documentation

## 2012-11-16 DIAGNOSIS — O365990 Maternal care for other known or suspected poor fetal growth, unspecified trimester, not applicable or unspecified: Secondary | ICD-10-CM | POA: Insufficient documentation

## 2012-11-16 DIAGNOSIS — O0933 Supervision of pregnancy with insufficient antenatal care, third trimester: Secondary | ICD-10-CM

## 2012-11-22 ENCOUNTER — Encounter: Payer: Self-pay | Admitting: Advanced Practice Midwife

## 2012-11-22 ENCOUNTER — Ambulatory Visit (INDEPENDENT_AMBULATORY_CARE_PROVIDER_SITE_OTHER): Payer: Medicaid Other | Admitting: Advanced Practice Midwife

## 2012-11-22 VITALS — BP 124/79 | Temp 97.2°F | Wt 139.0 lb

## 2012-11-22 DIAGNOSIS — O365930 Maternal care for other known or suspected poor fetal growth, third trimester, not applicable or unspecified: Secondary | ICD-10-CM | POA: Insufficient documentation

## 2012-11-22 DIAGNOSIS — O365990 Maternal care for other known or suspected poor fetal growth, unspecified trimester, not applicable or unspecified: Secondary | ICD-10-CM

## 2012-11-22 LAB — POCT URINALYSIS DIP (DEVICE)
Bilirubin Urine: NEGATIVE
Glucose, UA: NEGATIVE mg/dL
Ketones, ur: NEGATIVE mg/dL
Specific Gravity, Urine: 1.025 (ref 1.005–1.030)

## 2012-11-22 NOTE — Progress Notes (Signed)
[place] reviewed. Growth was 23%ile with AC at 8%ile. They state this may represent early IUGR. NST done today and it was reactive. Good accels and variability. GBS negative. Labor precautions reviewed.

## 2012-11-22 NOTE — Progress Notes (Signed)
Pulse: 85

## 2012-11-22 NOTE — Patient Instructions (Signed)
Vaginal Delivery  Your caregiver must first be sure you are in labor. Signs of labor include:   You may pass what is called "the mucus plug" before labor begins. This is a small amount of blood stained mucus.   Regular uterine contractions.   The time between contractions get closer together.   The discomfort and pain gradually gets more intense.   Pains are mostly located in the back.   Pains get worse when walking.   The cervix (the opening of the uterus) becomes thinner (begins to efface) and opens up (dilates).  Once you are in labor and admitted into the hospital or care center, your caregiver will do the following:   A complete physical examination.   Check your vital signs (blood pressure, pulse, temperature and the fetal heart rate).   Do a vaginal examination (using a sterile glove and lubricant) to determine:   The position (presentation) of the baby (head [vertex] or buttock first).   The level (station) of the baby's head in the birth canal.   The effacement and dilatation of the cervix.   You may have your pubic hair shaved and be given an enema depending on your caregiver and the circumstance.   An electronic monitor is usually placed on your abdomen. The monitor follows the length and intensity of the contractions, as well as the baby's heart rate.   Usually, your caregiver will insert an IV in your arm with a bottle of sugar water. This is done as a precaution so that medications can be given to you quickly during labor or delivery.  NORMAL LABOR AND DELIVERY IS DIVIDED UP INTO 3 STAGES:  First Stage  This is when regular contractions begin and the cervix begins to efface and dilate. This stage can last from 3 to 15 hours. The end of the first stage is when the cervix is 100% effaced and 10 centimeters dilated. Pain medications may be given by    Injection (morphine, demerol, etc.)    Regional anesthesia (spinal, caudal or epidural, anesthetics given in different locations of the spine). Paracervical pain medication may be given, which is an injection of and anesthetic on each side of the cervix.  A pregnant woman may request to have "Natural Childbirth" which is not to have any medications or anesthesia during her labor and delivery.  Second Stage  This is when the baby comes down through the birth canal (vagina) and is born. This can take 1 to 4 hours. As the baby's head comes down through the birth canal, you may feel like you are going to have a bowel movement. You will get the urge to bear down and push until the baby is delivered. As the baby's head is being delivered, the caregiver will decide if an episiotomy (a cut in the perineum and vagina area) is needed to prevent tearing of the tissue in this area. The episiotomy is sewn up after the delivery of the baby and placenta. Sometimes a mask with nitrous oxide is given for the mother to breath during the delivery of the baby to help if there is too much pain. The end of Stage 2 is when the baby is fully delivered. Then when the umbilical cord stops pulsating it is clamped and cut.  Third Stage  The third stage begins after the baby is completely delivered and ends after the placenta (afterbirth) is delivered. This usually takes 5 to 30 minutes. After the placenta is delivered, a medication is given   either by intravenous or injection to help contract the uterus and prevent bleeding. The third stage is not painful and pain medication is usually not necessary. If an episiotomy was done, it is repaired at this time.  After the delivery, the mother is watched and monitored closely for 1 to 2 hours to make sure there is no postpartum bleeding (hemorrhage). If there is a lot of bleeding, medication is given to contract the uterus and stop the bleeding.  Document Released: 03/02/2008 Document Revised: 02/16/2012 Document Reviewed: 03/02/2008   [employer] Patient Information 2014 [employer].

## 2012-11-24 ENCOUNTER — Telehealth: Payer: Self-pay | Admitting: *Deleted

## 2012-11-24 NOTE — Telephone Encounter (Signed)
Ajola left a message that she is calling about a question about a procedue (nst) she had at her last appointment 6/18 and does she need to start coming twice a week for that - please call

## 2012-11-27 NOTE — Telephone Encounter (Signed)
Called patient back and told her I received her message asking about the NST and that the NST she had on the 18th was just a one time thing and that I spoke to Nancy Moran after her appt and Nancy Moran was not planning on twice weekly testing. Patient verbalized understanding and asked how the baby did on the test. I told her baby did great and the test was "reactive or normal". Patient vebalized understanding and had no further questions

## 2012-11-29 ENCOUNTER — Ambulatory Visit (INDEPENDENT_AMBULATORY_CARE_PROVIDER_SITE_OTHER): Payer: Medicaid Other | Admitting: Advanced Practice Midwife

## 2012-11-29 VITALS — BP 125/72 | Temp 97.6°F | Wt 139.0 lb

## 2012-11-29 DIAGNOSIS — O0933 Supervision of pregnancy with insufficient antenatal care, third trimester: Secondary | ICD-10-CM

## 2012-11-29 DIAGNOSIS — O0930 Supervision of pregnancy with insufficient antenatal care, unspecified trimester: Secondary | ICD-10-CM

## 2012-11-29 DIAGNOSIS — D582 Other hemoglobinopathies: Secondary | ICD-10-CM

## 2012-11-29 LAB — POCT URINALYSIS DIP (DEVICE)
Bilirubin Urine: NEGATIVE
Glucose, UA: NEGATIVE mg/dL
Ketones, ur: NEGATIVE mg/dL
Specific Gravity, Urine: 1.01 (ref 1.005–1.030)
Urobilinogen, UA: 0.2 mg/dL (ref 0.0–1.0)

## 2012-11-29 NOTE — Progress Notes (Signed)
No complaints, feeling well.

## 2012-11-29 NOTE — Progress Notes (Signed)
Pulse- 80 

## 2012-12-07 ENCOUNTER — Ambulatory Visit (INDEPENDENT_AMBULATORY_CARE_PROVIDER_SITE_OTHER): Payer: Medicaid Other | Admitting: Obstetrics & Gynecology

## 2012-12-07 VITALS — BP 117/77 | Temp 97.0°F | Wt 144.6 lb

## 2012-12-07 DIAGNOSIS — O365990 Maternal care for other known or suspected poor fetal growth, unspecified trimester, not applicable or unspecified: Secondary | ICD-10-CM

## 2012-12-07 DIAGNOSIS — O365930 Maternal care for other known or suspected poor fetal growth, third trimester, not applicable or unspecified: Secondary | ICD-10-CM

## 2012-12-07 LAB — POCT URINALYSIS DIP (DEVICE)
Bilirubin Urine: NEGATIVE
Glucose, UA: NEGATIVE mg/dL
Hgb urine dipstick: NEGATIVE
Ketones, ur: NEGATIVE mg/dL
Nitrite: NEGATIVE

## 2012-12-07 NOTE — Patient Instructions (Signed)
Vaginal Delivery  Your caregiver must first be sure you are in labor. Signs of labor include:   You may pass what is called "the mucus plug" before labor begins. This is a small amount of blood stained mucus.   Regular uterine contractions.   The time between contractions get closer together.   The discomfort and pain gradually gets more intense.   Pains are mostly located in the back.   Pains get worse when walking.   The cervix (the opening of the uterus) becomes thinner (begins to efface) and opens up (dilates).  Once you are in labor and admitted into the hospital or care center, your caregiver will do the following:   A complete physical examination.   Check your vital signs (blood pressure, pulse, temperature and the fetal heart rate).   Do a vaginal examination (using a sterile glove and lubricant) to determine:   The position (presentation) of the baby (head [vertex] or buttock first).   The level (station) of the baby's head in the birth canal.   The effacement and dilatation of the cervix.   You may have your pubic hair shaved and be given an enema depending on your caregiver and the circumstance.   An electronic monitor is usually placed on your abdomen. The monitor follows the length and intensity of the contractions, as well as the baby's heart rate.   Usually, your caregiver will insert an IV in your arm with a bottle of sugar water. This is done as a precaution so that medications can be given to you quickly during labor or delivery.  NORMAL LABOR AND DELIVERY IS DIVIDED UP INTO 3 STAGES:  First Stage  This is when regular contractions begin and the cervix begins to efface and dilate. This stage can last from 3 to 15 hours. The end of the first stage is when the cervix is 100% effaced and 10 centimeters dilated. Pain medications may be given by    Injection (morphine, demerol, etc.)    Regional anesthesia (spinal, caudal or epidural, anesthetics given in different locations of the spine). Paracervical pain medication may be given, which is an injection of and anesthetic on each side of the cervix.  A pregnant woman may request to have "Natural Childbirth" which is not to have any medications or anesthesia during her labor and delivery.  Second Stage  This is when the baby comes down through the birth canal (vagina) and is born. This can take 1 to 4 hours. As the baby's head comes down through the birth canal, you may feel like you are going to have a bowel movement. You will get the urge to bear down and push until the baby is delivered. As the baby's head is being delivered, the caregiver will decide if an episiotomy (a cut in the perineum and vagina area) is needed to prevent tearing of the tissue in this area. The episiotomy is sewn up after the delivery of the baby and placenta. Sometimes a mask with nitrous oxide is given for the mother to breath during the delivery of the baby to help if there is too much pain. The end of Stage 2 is when the baby is fully delivered. Then when the umbilical cord stops pulsating it is clamped and cut.  Third Stage  The third stage begins after the baby is completely delivered and ends after the placenta (afterbirth) is delivered. This usually takes 5 to 30 minutes. After the placenta is delivered, a medication is given   either by intravenous or injection to help contract the uterus and prevent bleeding. The third stage is not painful and pain medication is usually not necessary. If an episiotomy was done, it is repaired at this time.  After the delivery, the mother is watched and monitored closely for 1 to 2 hours to make sure there is no postpartum bleeding (hemorrhage). If there is a lot of bleeding, medication is given to contract the uterus and stop the bleeding.  Document Released: 03/02/2008 Document Revised: 02/16/2012 Document Reviewed: 03/02/2008   [employer] Patient Information 2014 [employer].

## 2012-12-07 NOTE — Progress Notes (Signed)
Pulse- 73 Patient reports pelvic pressure and some contractions

## 2012-12-07 NOTE — Progress Notes (Signed)
Some sharp pains, no leaking or bleeding. Labor precautions

## 2012-12-10 LAB — CULTURE, OB URINE

## 2012-12-11 ENCOUNTER — Ambulatory Visit (INDEPENDENT_AMBULATORY_CARE_PROVIDER_SITE_OTHER): Payer: Medicaid Other | Admitting: *Deleted

## 2012-12-11 VITALS — BP 129/89

## 2012-12-11 DIAGNOSIS — O365990 Maternal care for other known or suspected poor fetal growth, unspecified trimester, not applicable or unspecified: Secondary | ICD-10-CM

## 2012-12-11 DIAGNOSIS — O365930 Maternal care for other known or suspected poor fetal growth, third trimester, not applicable or unspecified: Secondary | ICD-10-CM

## 2012-12-11 DIAGNOSIS — O365931 Maternal care for other known or suspected poor fetal growth, third trimester, fetus 1: Secondary | ICD-10-CM

## 2012-12-11 DIAGNOSIS — O480 Post-term pregnancy: Secondary | ICD-10-CM

## 2012-12-11 NOTE — Progress Notes (Signed)
P = 74  Pt prefers not to be induced @ 41 wks- will discuss w/MD on 7/10 @ prenatal visit.

## 2012-12-11 NOTE — Addendum Note (Signed)
Addended by: Jill Side on: 12/11/2012 04:52 PM   Modules accepted: Level of Service

## 2012-12-12 NOTE — Progress Notes (Signed)
NST performed today was reviewed and was found to be reactive. AFI normal 8. 3 cm. Continue recommended antenatal testing and prenatal care.

## 2012-12-14 ENCOUNTER — Encounter: Payer: Self-pay | Admitting: Family Medicine

## 2012-12-14 ENCOUNTER — Ambulatory Visit (INDEPENDENT_AMBULATORY_CARE_PROVIDER_SITE_OTHER): Payer: Medicaid Other | Admitting: Family Medicine

## 2012-12-14 VITALS — BP 130/81 | Temp 98.0°F | Wt 143.3 lb

## 2012-12-14 DIAGNOSIS — O0933 Supervision of pregnancy with insufficient antenatal care, third trimester: Secondary | ICD-10-CM

## 2012-12-14 DIAGNOSIS — O480 Post-term pregnancy: Secondary | ICD-10-CM

## 2012-12-14 DIAGNOSIS — O0930 Supervision of pregnancy with insufficient antenatal care, unspecified trimester: Secondary | ICD-10-CM

## 2012-12-14 LAB — POCT URINALYSIS DIP (DEVICE)
Bilirubin Urine: NEGATIVE
Glucose, UA: NEGATIVE mg/dL
Nitrite: NEGATIVE
Specific Gravity, Urine: 1.01 (ref 1.005–1.030)
Urobilinogen, UA: 1 mg/dL (ref 0.0–1.0)

## 2012-12-14 NOTE — Progress Notes (Signed)
Pulse- 68 Patient reports swelling in right lower leg, pelvic pressure & some contractions

## 2012-12-14 NOTE — Patient Instructions (Addendum)
Breastfeeding A change in hormones during your pregnancy causes growth of your breast tissue and an increase in number and size of milk ducts. The hormone prolactin allows proteins, sugars, and fats from your blood supply to make breast milk in your milk-producing glands. The hormone progesterone prevents breast milk from being released before the birth of your baby. After the birth of your baby, your progesterone level decreases allowing breast milk to be released. Thoughts of your baby, as well as his or her sucking or crying, can stimulate the release of milk from the milk-producing glands. Deciding to breastfeed (nurse) is one of the best choices you can make for you and your baby. The information that follows gives a brief review of the benefits, as well as other important skills to know about breastfeeding. BENEFITS OF BREASTFEEDING For your baby  The first milk (colostrum) helps your baby's digestive system function better.   There are antibodies in your milk that help your baby fight off infections.   Your baby has a lower incidence of asthma, allergies, and sudden infant death syndrome (SIDS).   The nutrients in breast milk are better for your baby than infant formulas.  Breast milk improves your baby's brain development.   Your baby will have less gas, colic, and constipation.  Your baby is less likely to develop other conditions, such as childhood obesity, asthma, or diabetes mellitus. For you  Breastfeeding helps develop a very special bond between you and your baby.   Breastfeeding is convenient, always available at the correct temperature, and costs nothing.   Breastfeeding helps to burn calories and helps you lose the weight gained during pregnancy.   Breastfeeding makes your uterus contract back down to normal size faster and slows bleeding following delivery.   Breastfeeding mothers have a lower risk of developing osteoporosis or breast or ovarian cancer later  in life.  BREASTFEEDING FREQUENCY  A healthy, full-term baby may breastfeed as often as every hour or space his or her feedings to every 3 hours. Breastfeeding frequency will vary from baby to baby.   Newborns should be fed no less than every 2 3 hours during the day and every 4 5 hours during the night. You should breastfeed a minimum of 8 feedings in a 24 hour period.  Awaken your baby to breastfeed if it has been 3 4 hours since the last feeding.  Breastfeed when you feel the need to reduce the fullness of your breasts or when your newborn shows signs of hunger. Signs that your baby may be hungry include:  Increased alertness or activity.  Stretching.  Movement of the head from side to side.  Movement of the head and opening of the mouth when the corner of the mouth or cheek is stroked (rooting).  Increased sucking sounds, smacking lips, cooing, sighing, or squeaking.  Hand-to-mouth movements.  Increased sucking of fingers or hands.  Fussing.  Intermittent crying.  Signs of extreme hunger will require calming and consoling before you try to feed your baby. Signs of extreme hunger may include:  Restlessness.  A loud, strong cry.  Screaming.  Frequent feeding will help you make more milk and will help prevent problems, such as sore nipples and engorgement of the breasts.  BREASTFEEDING   Whether lying down or sitting, be sure that the baby's abdomen is facing your abdomen.   Support your breast with 4 fingers under your breast and your thumb above your nipple. Make sure your fingers are well away from  your nipple and your baby's mouth.   Stroke your baby's lips gently with your finger or nipple.   When your baby's mouth is open wide enough, place all of your nipple and as much of the colored area around your nipple (areola) as possible into your baby's mouth.  More areola should be visible above his or her upper lip than below his or her lower lip.  Your  baby's tongue should be between his or her lower gum and your breast.  Ensure that your baby's mouth is correctly positioned around the nipple (latched). Your baby's lips should create a seal on your breast.  Signs that your baby has effectively latched onto your nipple include:  Tugging or sucking without pain.  Swallowing heard between sucks.  Absent click or smacking sound.  Muscle movement above and in front of his or her ears with sucking.  Your baby must suck about 2 3 minutes in order to get your milk. Allow your baby to feed on each breast as [place] as he or she wants. Nurse your baby until he or she unlatches or falls asleep at the first breast, then offer the second breast.  Signs that your baby is full and satisfied include:  A gradual decrease in the number of sucks or complete cessation of sucking.  Falling asleep.  Extension or relaxation of his or her body.  Retention of a small amount of milk in his or her mouth.  Letting go of your breast by himself or herself.  Signs of effective breastfeeding in you include:  Breasts that have increased firmness, weight, and size prior to feeding.  Breasts that are softer after nursing.  Increased milk volume, as well as a change in milk consistency and color by the 5th day of breastfeeding.  Breast fullness relieved by breastfeeding.  Nipples are not sore, cracked, or bleeding.  If needed, break the suction by putting your finger into the corner of your baby's mouth and sliding your finger between his or her gums. Then, remove your breast from his or her mouth.  It is common for babies to spit up a small amount after a feeding.  Babies often swallow air during feeding. This can make babies fussy. Burping your baby between breasts can help with this.  Vitamin D supplements are recommended for babies who get only breast milk.  Avoid using a pacifier during your baby's first 4 6 weeks.  Avoid supplemental feedings of  water, formula, or juice in place of breastfeeding. Breast milk is all the food your baby needs. It is not necessary for your baby to have water or formula. Your breasts will make more milk if supplemental feedings are avoided during the early weeks. HOW TO TELL WHETHER YOUR BABY IS GETTING ENOUGH BREAST MILK Wondering whether or not your baby is getting enough milk is a common concern among mothers. You can be assured that your baby is getting enough milk if:   Your baby is actively sucking and you hear swallowing.   Your baby seems relaxed and satisfied after a feeding.   Your baby nurses at least 8 12 times in a 24 hour time period.  During the first 22 28 days of age:  Your baby is wetting at least 3 5 diapers in a 24 hour period. The urine should be clear and pale yellow.  Your baby is having at least 3 4 stools in a 24 hour period. The stool should be soft and yellow.  At  35 50 days of age, your baby is having at least 3 6 stools in a 24 hour period. The stool should be seedy and yellow by 35 days of age.  Your baby has a weight loss less than 7 10% during the first 7 days of age.  Your baby does not lose weight after 55 19 days of age.  Your baby gains 4 7 ounces each week after he or she is 49 days of age.  Your baby gains weight by 34 days of age and is back to birth weight within 2 weeks. ENGORGEMENT In the first week after your baby is born, you may experience extremely full breasts (engorgement). When engorged, your breasts may feel heavy, warm, or tender to the touch. Engorgement peaks within 24 48 hours after delivery of your baby.  Engorgement may be reduced by:  Continuing to breastfeed.  Increasing the frequency of breastfeeding.  Taking warm showers or applying warm, moist heat to your breasts just before each feeding. This increases circulation and helps the milk flow.   Gently massaging your breast before and during the feedings. With your fingertips, massage from  your chest wall towards your nipple in a circular motion.   Ensuring that your baby empties at least one breast at every feeding. It also helps to start the next feeding on the opposite breast.   Expressing breast milk by hand or by using a breast pump to empty the breasts if your baby is sleepy, or not nursing well. You may also want to express milk if you are returning to work oryou feel you are getting engorged.  Ensuring your baby is latched on and positioned properly while breastfeeding. If you follow these suggestions, your engorgement should improve in 24 48 hours. If you are still experiencing difficulty, call your lactation consultant or caregiver.  CARING FOR YOURSELF Take care of your breasts.  Bathe or shower daily.   Avoid using soap on your nipples.   Wear a supportive bra. Avoid wearing underwire style bras.  Air dry your nipples for a 3 after each feeding.   Use only cotton bra pads to absorb breast milk leakage. Leaking of breast milk between feedings is normal.   Use only pure lanolin on your nipples after nursing. You do not need to wash it off before feeding your baby again. Another option is to express a few drops of breast milk and gently massage that milk into your nipples.  Continue breast self-awareness checks. Take care of yourself.  Eat healthy foods. Alternate 3 meals with 3 snacks.  Avoid foods that you notice affect your baby in a bad way.  Drink milk, fruit juice, and water to satisfy your thirst (about 8 glasses a day).   Rest often, relax, and take your prenatal vitamins to prevent fatigue, stress, and anemia.  Avoid chewing and smoking tobacco.  Avoid alcohol and drug use.  Take over-the-counter and prescribed medicine only as directed by your caregiver or pharmacist. You should always check with your caregiver or pharmacist before taking any new medicine, vitamin, or herbal supplement.  Know that pregnancy is possible while  breastfeeding. If desired, talk to your caregiver about family planning and safe birth control methods that may be used while breastfeeding. SEEK MEDICAL CARE IF:   You feel like you want to stop breastfeeding or have become frustrated with breastfeeding.  You have painful breasts or nipples.  Your nipples are cracked or bleeding.  Your breasts are red, tender,  or warm.  You have a swollen area on either breast.  You have a fever or chills.  You have nausea or vomiting.  You have drainage from your nipples.  Your breasts do not become full before feedings by the 5th day after delivery.  You feel sad and depressed.  Your baby is too sleepy to eat well.  Your baby is having trouble sleeping.   Your baby is wetting less than 3 diapers in a 24 hour period.  Your baby has less than 3 stools in a 24 hour period.  Your baby's skin or the white part of his or her eyes becomes more yellow.   Your baby is not gaining weight by 4 days of age. MAKE SURE YOU:   Understand these instructions.  Will watch your condition.  Will get help right away if you are not doing well or get worse. Document Released: 05/24/2005 Document Revised: 02/16/2012 Document Reviewed: 12/29/2011 [facility] Patient Information 2014 [place], [place]. Vaginal Delivery Your caregiver must first be sure you are in labor. Signs of labor include:  You may pass what is called "the mucus plug" before labor begins. This is a small amount of blood stained mucus.  Regular uterine contractions.  The time between contractions get closer together.  The discomfort and pain gradually gets more intense.  Pains are mostly located in the back.  Pains get worse when walking.  The cervix (the opening of the uterus) becomes thinner (begins to efface) and opens up (dilates). Once you are in labor and admitted into the hospital or care center, your caregiver will do the following:  A complete physical  examination.  Check your vital signs (blood pressure, pulse, temperature and the fetal heart rate).  Do a vaginal examination (using a sterile glove and lubricant) to determine:  The position (presentation) of the baby (head [vertex] or buttock first).  The level (station) of the baby's head in the birth canal.  The effacement and dilatation of the cervix.  You may have your pubic hair shaved and be given an enema depending on your caregiver and the circumstance.  An electronic monitor is usually placed on your abdomen. The monitor follows the length and intensity of the contractions, as well as the baby's heart rate.  Usually, your caregiver will insert an IV in your arm with a bottle of sugar water. This is done as a precaution so that medications can be given to you quickly during labor or delivery. NORMAL LABOR AND DELIVERY IS DIVIDED UP INTO 3 STAGES: First Stage This is when regular contractions begin and the cervix begins to efface and dilate. This stage can last from 3 to 15 hours. The end of the first stage is when the cervix is 100% effaced and 10 centimeters dilated. Pain medications may be given by   Injection (morphine, demerol, etc.)  Regional anesthesia (spinal, caudal or epidural, anesthetics given in different locations of the spine). Paracervical pain medication may be given, which is an injection of and anesthetic on each side of the cervix. A pregnant woman may request to have "Natural Childbirth" which is not to have any medications or anesthesia during her labor and delivery. Second Stage This is when the baby comes down through the birth canal (vagina) and is born. This can take 1 to 4 hours. As the baby's head comes down through the birth canal, you may feel like you are going to have a bowel movement. You will get the urge to bear down  and push until the baby is delivered. As the baby's head is being delivered, the caregiver will decide if an episiotomy (a cut in  the perineum and vagina area) is needed to prevent tearing of the tissue in this area. The episiotomy is sewn up after the delivery of the baby and placenta. Sometimes a mask with nitrous oxide is given for the mother to breath during the delivery of the baby to help if there is too much pain. The end of Stage 2 is when the baby is fully delivered. Then when the umbilical cord stops pulsating it is clamped and cut. Third Stage The third stage begins after the baby is completely delivered and ends after the placenta (afterbirth) is delivered. This usually takes 5 to 30 minutes. After the placenta is delivered, a medication is given either by intravenous or injection to help contract the uterus and prevent bleeding. The third stage is not painful and pain medication is usually not necessary. If an episiotomy was done, it is repaired at this time. After the delivery, the mother is watched and monitored closely for 1 to 2 hours to make sure there is no postpartum bleeding (hemorrhage). If there is a lot of bleeding, medication is given to contract the uterus and stop the bleeding. Document Released: 03/02/2008 Document Revised: 02/16/2012 Document Reviewed: 03/02/2008 [facility] Patient Information 2014 [place], [place].

## 2012-12-14 NOTE — Progress Notes (Signed)
NST reviewed and reactive. Post-dates contractions noted.  Does not want IOL--will schedule for her on Monday if undelivered for 41 6/7 wks

## 2012-12-17 ENCOUNTER — Inpatient Hospital Stay (HOSPITAL_COMMUNITY)
Admission: AD | Admit: 2012-12-17 | Discharge: 2012-12-19 | DRG: 775 | Disposition: A | Discharge status: Home / Self Care | Payer: Medicaid Other | Source: Ambulatory Visit | Attending: Obstetrics & Gynecology | Admitting: Obstetrics & Gynecology

## 2012-12-17 ENCOUNTER — Encounter (HOSPITAL_COMMUNITY): Payer: Self-pay | Admitting: *Deleted

## 2012-12-17 DIAGNOSIS — O365990 Maternal care for other known or suspected poor fetal growth, unspecified trimester, not applicable or unspecified: Principal | ICD-10-CM | POA: Diagnosis present

## 2012-12-17 DIAGNOSIS — O0933 Supervision of pregnancy with insufficient antenatal care, third trimester: Secondary | ICD-10-CM

## 2012-12-17 LAB — CBC
HCT: 35.2 % — ABNORMAL LOW (ref 36.0–46.0)
MCH: 30 pg (ref 26.0–34.0)
MCHC: 36.9 g/dL — ABNORMAL HIGH (ref 30.0–36.0)
MCV: 81.3 fL (ref 78.0–100.0)
RDW: 13.4 % (ref 11.5–15.5)

## 2012-12-17 LAB — ABO/RH: ABO/RH(D): A POS

## 2012-12-17 MED ORDER — LIDOCAINE HCL (PF) 1 % IJ SOLN
30.0000 mL | INTRAMUSCULAR | Status: DC | PRN
  Administered 2012-12-17: 30 mL via SUBCUTANEOUS
  Filled 2012-12-17 (×2): qty 30

## 2012-12-17 MED ORDER — OXYTOCIN BOLUS FROM INFUSION: 500.0000 mL | INTRAVENOUS | Status: DC

## 2012-12-17 MED ORDER — OXYTOCIN 40 UNITS IN LACTATED RINGERS INFUSION - SIMPLE MED: 62.5000 mL/h | INTRAVENOUS | Status: DC

## 2012-12-17 MED ORDER — OXYTOCIN 10 UNIT/ML IJ SOLN
INTRAMUSCULAR | Status: AC
  Filled 2012-12-17: qty 2

## 2012-12-17 MED ORDER — NALBUPHINE SYRINGE 5 MG/0.5 ML
10.0000 mg | INJECTION | INTRAMUSCULAR | Status: DC | PRN
  Filled 2012-12-17: qty 1

## 2012-12-17 MED ORDER — CITRIC ACID-SODIUM CITRATE 334-500 MG/5ML PO SOLN: 30.0000 mL | ORAL | Status: DC | PRN

## 2012-12-17 MED ORDER — LACTATED RINGERS IV SOLN
500.0000 mL | INTRAVENOUS | Status: DC | PRN
Start: 2012-12-17 — End: 2012-12-18

## 2012-12-17 MED ORDER — OXYCODONE-ACETAMINOPHEN 5-325 MG PO TABS: 1.0000 | ORAL_TABLET | ORAL | Status: DC | PRN

## 2012-12-17 MED ORDER — IBUPROFEN 600 MG PO TABS: 600.0000 mg | ORAL_TABLET | Freq: Four times a day (QID) | ORAL | Status: DC | PRN

## 2012-12-17 MED ORDER — ONDANSETRON HCL 4 MG/2ML IJ SOLN: 4.0000 mg | Freq: Four times a day (QID) | INTRAMUSCULAR | Status: DC | PRN

## 2012-12-17 MED ORDER — ACETAMINOPHEN 325 MG PO TABS: 650.0000 mg | ORAL_TABLET | ORAL | Status: DC | PRN

## 2012-12-17 MED ORDER — LACTATED RINGERS IV SOLN: INTRAVENOUS | Status: DC

## 2012-12-17 NOTE — Progress Notes (Signed)
Nancy Moran is a 22 y.o. G1P0 at [redacted]w[redacted]d by ultrasound admitted for active labor  Subjective: Feels pressure with contractions but refuses exam   Objective: BP 130/80  Pulse 76  Temp(Src) 97.8 F (36.6 C) (Oral)  Resp 18  Ht 5' (1.524 m)  Wt 64.864 kg (143 lb)  BMI 27.93 kg/m2  LMP 04/03/2012      FHT:  FHR: 145 bpm, variability: moderate,  accelerations:  Present,  decelerations:  Absent UC:   regular, every 3 minutes SVE:   Dilation: 6 Effacement (%): 80 Station: -2 Exam by:: A.Davis,RN  Labs: Lab Results  Component Value Date   WBC 8.5 12/17/2012   HGB 13.0 12/17/2012   HCT 35.2* 12/17/2012   MCV 81.3 12/17/2012   PLT 227 12/17/2012    Assessment / Plan: Spontaneous labor, progressing normally  Labor: Progressing normally Preeclampsia:  n/a Fetal Wellbeing:  Category I Pain Control:  Labor support without medications I/D:  n/a Anticipated MOD:  NSVD  Sirena Riddle 12/17/2012, 9:43 PM

## 2012-12-17 NOTE — Progress Notes (Signed)
Pt has order for IV but refuses, and refuses saline lock option. Dr. Thad Ranger aware of pt's refusal.

## 2012-12-17 NOTE — Progress Notes (Signed)
Pt involuntarily pushing, RN remains at bedside, encouraging pt to try hands and knees position

## 2012-12-17 NOTE — Progress Notes (Signed)
Patient ID: Nancy Moran, female   DOB: 1990-10-12, 22 y.o.   MRN: [id number]  Called for delivery by RN who stated she thought patient was complete.   Patient pushed a time or two and red blood was seen trickling  Cervix rechecked and found to be 8cm  Pt instructed to stop pushing and turned to hands/knees position for comfort  Will watch for second stage

## 2012-12-17 NOTE — H&P (Signed)
Nancy Moran is a 22 y.o. female presenting for onset of labor.  History - Pt reports contractions since 5am that increased in intensity and frequency around noon to q5-7 minutes. Denies vaginal bleeding, gush of fluid, or change in fetal movement.   ROS - Denies fevers, chills, vomiting, diarrhea, nausea, rash, vision change, headache, or rash.  OB History   Grav Para Term Preterm Abortions TAB SAB Ect Mult Living   1             Patient was seen in low risk clinic. Denies DM or HTN with this pregnancy. Toward the end was switched to high risk clinic but unsure why US - Fetal growth curve shows interval decline, with EFW at 23rd%ile and AC at 8%ile. This suggests possibility of developing asymmetric IUGR. AFI 15.86cm. No late developing fetal abnormalities seen involving visualized anatomy.  Past Medical History  Diagnosis Date  . Asthma    NKDA No surgeries Medications - prenatal vitamins  History reviewed. No pertinent past surgical history. Family History: family history includes Hypertension in her mother and sister. Social History:  reports that she has never smoked. She does not have any smokeless tobacco history on file. She reports that she does not drink alcohol or use illicit drugs. Denies tobacco, drugs, etoh.  Prenatal Transfer Tool  Maternal Diabetes: No (glucola 54) Genetic Screening: Normal  (quad neg) Maternal Ultrasounds/Referrals: Abnormal:  Findings:   Other: Possible evolving IUGR Fetal Ultrasounds or other Referrals:  None Maternal Substance Abuse:  No Significant Maternal Medications:  None Significant Maternal Lab Results:  Lab values include: Group B Strep negative Other Comments:  None  ROS - Per HPI  Dilation: 4 Effacement (%): 80 Station: -2 Exam by:: Danella Penton RN Vertex  Blood pressure 138/88, pulse 65, temperature 97.7 F (36.5 C), temperature source Oral, resp. rate 18, height 5' (1.524 m), weight 64.864 kg (143 lb), last menstrual period  04/03/2012. Exam Physical Exam  Constitutional: She appears well-developed and well-nourished. No distress.  Very uncomfortable-appearing  HENT:  Head: Normocephalic and atraumatic.  Eyes: Conjunctivae and EOM are normal.  Neck: Normal range of motion. Neck supple.  Cardiovascular: Normal rate, regular rhythm and normal heart sounds.   No murmur heard. Respiratory: Effort normal and breath sounds normal. No stridor. No respiratory distress. She has no wheezes. She has no rales. She exhibits no tenderness.  GI: There is no tenderness. There is no rebound and no guarding.  Gravid  Musculoskeletal: Normal range of motion. She exhibits no edema and no tenderness.  Neurological: She is alert.  Skin: Skin is warm and dry. No rash noted. She is not diaphoretic. No erythema. No pallor.  Psychiatric: She has a normal mood and affect. Her behavior is normal.    FHT: 130s BPM, moderate variability, accelerations present, no decelerations  Tocometry: q6 minutes, regular, painful  Prenatal labs: ABO, Rh: A/POS/-- (03/19 0930) Antibody: NEG (03/19 0930) Rubella: 3.75 (03/19 0930) RPR: NON REAC (04/23 1042)  HBsAg: NEGATIVE (03/19 0930)  HIV: NON REACTIVE (04/23 1042)  GBS: Negative (06/04 0000)   Assessment/Plan: 22 y.o. G1P0 at [redacted]w[redacted]d here for spontaneous onset of labor, pregnancy complicated by possible evolving IUGR on most recent [place] 6/18. - Admit to birthing suites - FHT reassuring, category I - Pain control: Does NOT want medications - Feeding preference: Breast; does NOT want circumcision; does NOT want birth control - Has dula - Anticipate NSVD - Hemoglobin C trait per notes   Simone Curia 12/17/2012, 4:56  PM   I saw and examined patient and agree with above resident note. I reviewed history, imaging, labs, and vitals. I personally reviewed the fetal heart tracing, and it is reactive.  Prolonged decel to 60s-90s upon arrival to MAU but reactive strip thereafter. Napoleon Form, MD

## 2012-12-17 NOTE — MAU Note (Signed)
Pt presents with complaints of contractions that started at 5am. Denies LOF or bleeding.

## 2012-12-18 ENCOUNTER — Encounter (HOSPITAL_COMMUNITY): Payer: Self-pay | Admitting: *Deleted

## 2012-12-18 ENCOUNTER — Other Ambulatory Visit: Payer: Medicaid Other

## 2012-12-18 MED ORDER — OXYCODONE-ACETAMINOPHEN 5-325 MG PO TABS: 1.0000 | ORAL_TABLET | ORAL | Status: DC | PRN

## 2012-12-18 MED ORDER — PRENATAL MULTIVITAMIN CH
1.0000 | ORAL_TABLET | Freq: Every day | ORAL | Status: DC
  Administered 2012-12-19: 1 via ORAL
  Filled 2012-12-18: qty 1

## 2012-12-18 MED ORDER — ONDANSETRON HCL 4 MG/2ML IJ SOLN: 4.0000 mg | INTRAMUSCULAR | Status: DC | PRN

## 2012-12-18 MED ORDER — LANOLIN HYDROUS EX OINT: TOPICAL_OINTMENT | CUTANEOUS | Status: DC | PRN

## 2012-12-18 MED ORDER — BENZOCAINE-MENTHOL 20-0.5 % EX AERO: 1.0000 "application " | INHALATION_SPRAY | CUTANEOUS | Status: DC | PRN

## 2012-12-18 MED ORDER — WITCH HAZEL-GLYCERIN EX PADS: 1.0000 "application " | MEDICATED_PAD | CUTANEOUS | Status: DC | PRN

## 2012-12-18 MED ORDER — IBUPROFEN 600 MG PO TABS
600.0000 mg | ORAL_TABLET | Freq: Four times a day (QID) | ORAL | Status: DC
  Administered 2012-12-19: 600 mg via ORAL
  Filled 2012-12-18 (×4): qty 1

## 2012-12-18 MED ORDER — SENNOSIDES-DOCUSATE SODIUM 8.6-50 MG PO TABS
2.0000 | ORAL_TABLET | Freq: Every day | ORAL | Status: DC
  Administered 2012-12-18: 2 via ORAL

## 2012-12-18 MED ORDER — DIBUCAINE 1 % RE OINT: 1.0000 "application " | TOPICAL_OINTMENT | RECTAL | Status: DC | PRN

## 2012-12-18 MED ORDER — ZOLPIDEM TARTRATE 5 MG PO TABS: 5.0000 mg | ORAL_TABLET | Freq: Every evening | ORAL | Status: DC | PRN

## 2012-12-18 MED ORDER — DIPHENHYDRAMINE HCL 25 MG PO CAPS: 25.0000 mg | ORAL_CAPSULE | Freq: Four times a day (QID) | ORAL | Status: DC | PRN

## 2012-12-18 MED ORDER — SIMETHICONE 80 MG PO CHEW: 80.0000 mg | CHEWABLE_TABLET | ORAL | Status: DC | PRN

## 2012-12-18 MED ORDER — ONDANSETRON HCL 4 MG PO TABS: 4.0000 mg | ORAL_TABLET | ORAL | Status: DC | PRN

## 2012-12-18 MED ORDER — TETANUS-DIPHTH-ACELL PERTUSSIS 5-2.5-18.5 LF-MCG/0.5 IM SUSP
0.5000 mL | Freq: Once | INTRAMUSCULAR | Status: AC
  Administered 2012-12-19: 0.5 mL via INTRAMUSCULAR
  Filled 2012-12-18: qty 0.5

## 2012-12-18 NOTE — Progress Notes (Signed)
UR chart review completed.  

## 2012-12-18 NOTE — Progress Notes (Signed)
Post Partum Day 1 Subjective: no complaints, up ad lib, voiding and tolerating PO Doing well but wants some help with breastfeeding  Objective: Blood pressure 117/76, pulse 83, temperature 98.2 F (36.8 C), temperature source Oral, resp. rate 20, height 5' (1.524 m), weight 64.864 kg (143 lb), last menstrual period 04/03/2012, unknown if currently breastfeeding.  Physical Exam:  General: alert, cooperative and no distress Lochia: appropriate Uterine Fundus: firm Incision: n/a DVT Evaluation: No evidence of DVT seen on physical exam.   Recent Labs  12/17/12 1842  HGB 13.0  HCT 35.2*    Assessment/Plan: Plan for discharge tomorrow, Breastfeeding and Lactation consult   LOS: 1 day   [facility] 12/18/2012, 7:30 AM

## 2012-12-19 MED ORDER — IBUPROFEN 600 MG PO TABS: 600.0000 mg | ORAL_TABLET | Freq: Four times a day (QID) | ORAL | Status: AC

## 2012-12-19 NOTE — Discharge Summary (Signed)
Obstetric Discharge Summary Reason for Admission: onset of labor Prenatal Procedures: ultrasound with possible evolving IUGR Intrapartum Procedures: spontaneous vaginal delivery Postpartum Procedures: none Complications-Operative and Postpartum: none Hemoglobin  Date Value Range Status  12/17/2012 13.0  12.0 - 15.0 g/dL Final     HCT  Date Value Range Status  12/17/2012 35.2* 36.0 - 46.0 % Final    Physical Exam:  General: alert, cooperative, appears stated age and no distress Lochia: appropriate Uterine Fundus: firm DVT Evaluation: No evidence of DVT seen on physical exam. No cords or calf tenderness. No significant calf/ankle edema. Normal work of breathing 2+ bilateral DP pulses  Discharge Diagnoses: Term Pregnancy-delivered  Discharge Information: Date: 12/19/2012 Activity: pelvic rest Diet: routine Medications: PNV, Ibuprofen and Miralax Condition: stable Instructions: refer to practice specific booklet Discharge to: home Feeding preference: Breast; does NOT want circumcision; does NOT want birth control Hemoglobin C trait per notes  Follow-up Information   Follow up with [facility]. Schedule an appointment as soon as possible for a visit in 6 weeks.   Contact information:   [address] [place] [address]       Newborn Data: Live born female  Birth Weight: 6 lb 8.9 oz (2975 g) APGAR: 8, 9  Home with mother.  Nancy Moran 12/19/2012, 7:57 AM  I saw and examined patient and agree with above resident note. I reviewed history, delivery summary, labs and vitals. Napoleon Form, MD

## 2012-12-19 NOTE — Lactation Note (Signed)
This note was copied from the chart of Boy Damaya Orris. Lactation Consultation Note  Patient Name: Boy Anastasija Anfinson [id number] Date: 12/19/2012 Reason for consult: Follow-up assessment   Maternal Data    Feeding   LATCH Score/Interventions  Type of Nipple: Everted at rest and after stimulation  Comfort (Breast/Nipple): Soft / non-tender      Lactation Tools Discussed/Used     Consult Status Consult Status: Complete  Mom reports that baby has just fed for 15 minutes. Is now sound asleep in her arms. Reviewed basic teaching. No further questions at present. Reviewed BFSG and OP appointments as resources for support after DC. To call prn  Pamelia Hoit 12/19/2012, 9:35 AM

## 2013-01-15 ENCOUNTER — Ambulatory Visit (INDEPENDENT_AMBULATORY_CARE_PROVIDER_SITE_OTHER): Payer: Medicaid Other | Admitting: Obstetrics and Gynecology

## 2013-01-15 ENCOUNTER — Encounter: Payer: Self-pay | Admitting: Obstetrics and Gynecology

## 2013-01-15 NOTE — Patient Instructions (Signed)

## 2013-01-15 NOTE — Progress Notes (Signed)
  Subjective:     Nancy Moran is a 22 y.o. female who presents for a postpartum visit. She is 4 weeks postpartum following a spontaneous vaginal delivery. I have fully reviewed the prenatal and intrapartum course. The delivery was at 38 gestational weeks. Outcome: spontaneous vaginal delivery. Anesthesia: none. Postpartum course has been uncomplicated. Baby's course has been uncomplicated. Baby is feeding by breast. Bleeding staining only. Bowel function is normal. Bladder function is normal. Patient is not sexually active. Contraception method is condoms. Postpartum depression screening: negative. Baby wakeful. Plans school in Fall.  The following portions of the patient's history were reviewed and updated as appropriate: allergies, current medications, past family history, past medical history, past social history, past surgical history and problem list.  Review of Systems Pertinent items are noted in HPI.   Objective:    BP 120/77  Pulse 86  Temp(Src) 96.8 F (36 C) (Oral)  Ht 4\' 11"  (1.499 m)  Wt 125 lb 11.2 oz (57.017 kg)  BMI 25.37 kg/m2  Breastfeeding? Yes 2 General:  alert, cooperative and no distress   Breasts:  inspection negative, no nipple discharge or bleeding, no masses or nodularity palpable  Lungs: clear to auscultation bilaterally  Heart:  regular rate and rhythm, S1, S2 normal, no murmur, click, rub or gallop  Abdomen: NT, well involuted                          Assessment:     4wk postpartum exam. Pap smear not done at today's visit.  PPD score 0  Plan:    1. Contraception: condoms and rhythm method 2. Pap due in April 2015 3. Follow up in: 8 months or as needed.

## 2013-01-16 ENCOUNTER — Encounter: Payer: Self-pay | Admitting: *Deleted

## 2013-04-12 ENCOUNTER — Other Ambulatory Visit: Payer: Self-pay

## 2013-11-11 ENCOUNTER — Emergency Department (INDEPENDENT_AMBULATORY_CARE_PROVIDER_SITE_OTHER): Payer: Self-pay

## 2013-11-11 ENCOUNTER — Emergency Department (INDEPENDENT_AMBULATORY_CARE_PROVIDER_SITE_OTHER)
Admission: EM | Admit: 2013-11-11 | Discharge: 2013-11-11 | Disposition: A | Discharge status: Home / Self Care | Payer: Self-pay | Source: Home / Self Care | Attending: Family Medicine | Admitting: Family Medicine

## 2013-11-11 ENCOUNTER — Encounter (HOSPITAL_COMMUNITY): Payer: Self-pay | Admitting: Emergency Medicine

## 2013-11-11 DIAGNOSIS — M654 Radial styloid tenosynovitis [de Quervain]: Secondary | ICD-10-CM | POA: Diagnosis present

## 2013-11-11 NOTE — ED Provider Notes (Signed)
CSN: [id number]     Arrival date & time 11/11/13  1254 History   First MD Initiated Contact with Patient 11/11/13 1441     Chief Complaint  Patient presents with  . Wrist Pain   (Consider location/radiation/quality/duration/timing/severity/associated sxs/prior Treatment) HPI  23 year old female who presents for evaluation of right wrist pain. It is ten months in duration. It is worsening over that time period she has tried icing it minimally. The pain is located at the base of her thumb on the radial side. It is exacerbated by lifting her child, and doing chores around the house, and lifting groceries. She feels like her she her grip strength is reduced. She denies any trauma or falls. His history of surgery on his wrist. She is breast-feeding.  Past Medical History  Diagnosis Date  . Asthma    History reviewed. No pertinent past surgical history. Family History  Problem Relation Age of Onset  . Hypertension Mother   . Hypertension Sister    History  Substance Use Topics  . Smoking status: Never Smoker   . Smokeless tobacco: Not on file  . Alcohol Use: No   OB History   Grav Para Term Preterm Abortions TAB SAB Ect Mult Living   1 1 1       1      Review of Systems Negative for elevating, shoulder pain, fever, chills, swelling of other joints  Allergies  Review of patient's allergies indicates no known allergies.  Home Medications   Prior to Admission medications   Medication Sig Start Date End Date Taking? Authorizing Provider  ibuprofen (ADVIL,MOTRIN) 600 MG tablet Take 1 tablet (600 mg total) by mouth every 6 (six) hours. 12/19/12   Leona Singleton, MD  Prenatal Vit-Fe Fumarate-FA (PRENATAL MULTIVITAMIN) TABS Take 1 tablet by mouth daily at 12 noon.    Historical Provider, MD   BP 109/74  Pulse 60  Temp(Src) 97.5 F (36.4 C) (Oral)  Resp 16  SpO2 100%  LMP 10/28/2013  Breastfeeding? Yes Physical Exam Gen: Young African American female, pleasant and  conversant, non-ill-appearing MSK: right wrist moderate tenderness to palpation at the lateral radial head and positive Finkelstein test, no redness, fluctuance, or edema; normal range of motion of the wrist and fingers of the right hand; left hand within normal limits Neurologic: sensation intact to palm or surface of the right hand ED Course  Procedures (including critical care time) Labs Review Labs Reviewed - No data to display  Imaging Review Dg Wrist Complete Right  11/11/2013   CLINICAL DATA:  Worsening right wrist pain.  Overuse injury.  EXAM: RIGHT WRIST - COMPLETE 3+ VIEW  COMPARISON:  None.  FINDINGS: There is no evidence of fracture or dislocation. There is no evidence of arthropathy or other focal bone abnormality. Soft tissues are unremarkable.  IMPRESSION: Negative.   Electronically Signed   By: Myles Rosenthal M.D.   On: 11/11/2013 14:48     MDM   1. Tommi Rumps Quervain's tenosynovitis, right    Conservative management with thumb spica splint, and stents, and ice. She will return if she desires a corticosteroid injection.    Garnetta Buddy, MD 11/11/13 1530

## 2013-11-11 NOTE — ED Provider Notes (Signed)
Medical screening examination/treatment/procedure(s) were performed by a resident physician or non-physician practitioner and as the supervising physician I was immediately available for consultation/collaboration.  Marinus Eicher, MD    Merryl Buckels S Abner Ardis, MD 11/11/13 1849 

## 2013-11-11 NOTE — Discharge Instructions (Signed)
You have inflammation of the tendon that runs up the thumb. It is worsened with movement. There is nothing out of joint. Please do the following things:   1. Wear the splint during the day to protect your finger 2. Take ibuprofen 400 mg 2-3 times a day with food. A small amount this will pass to your child to breast milk but not enough to harm him. 3. Regular icing of the right wrist when it is sore  Patient started improving the next 2 weeks. If it does not, then please return for evaluation for a steroid injection.  Sincerely,   Dr. Fredonia Highland Quervain's Disease Suzette Battiest disease is a condition often seen in racquet sports where there is a soreness (inflammation) in the cord like structures (tendons) which attach muscle to bone on the thumb side of the wrist. There may be a tightening of the tissuesaround the tendons. This condition is often helped by giving up or modifying the activity which caused it. When conservative treatment does not help, surgery may be required. Conservative treatment could include changes in the activity which brought about the problem or made it worse. Anti-inflammatory medications and injections may be used to help decrease the inflammation and help with pain control. Your caregiver will help you determine which is best for you. DIAGNOSIS  Often the diagnosis (learning what is wrong) can be made by examination. Sometimes x-rays are required. HOME CARE INSTRUCTIONS   Apply ice to the sore area for 15-20 minutes, 03-04 times per day while awake. Put the ice in a plastic bag and place a towel between the bag of ice and your skin. This is especially helpful if it can be done after all activities involving the sore wrist.  Temporary splinting may help.  Only take over-the-counter or prescription medicines for pain, discomfort or fever as directed by your caregiver. SEEK MEDICAL CARE IF:   Pain relief is not obtained with medications, or if you have increasing  pain and seem to be getting worse rather than better. MAKE SURE YOU:   Understand these instructions.  Will watch your condition.  Will get help right away if you are not doing well or get worse. Document Released: 02/16/2001 Document Revised: 08/16/2011 Document Reviewed: 05/24/2005 [facility] Patient Information 2014 [place], [place].

## 2013-11-11 NOTE — ED Notes (Signed)
States has been having chronic right wrist pain since 01/2013 since giving birth to son - felt it may have been aggravated by holding him to breastfeed, etc.  Pain has continued, but approx 20 min ago while carrying groceries and pushing open door, felt sudden onset "excruciating" pain in right lateral wrist.  States feels like the bones are rubbing together.  Has applied ice.  Pt is breastfeeding.

## 2014-04-08 ENCOUNTER — Encounter (HOSPITAL_COMMUNITY): Payer: Self-pay | Admitting: Emergency Medicine

## 2015-01-03 IMAGING — US US OB FOLLOW-UP
1 series · 12 of 28 positions shown · non-contrast
Comparison: none

[Series 1: us ob follow up · 12 of 47 slices shown]
[im 2/47]
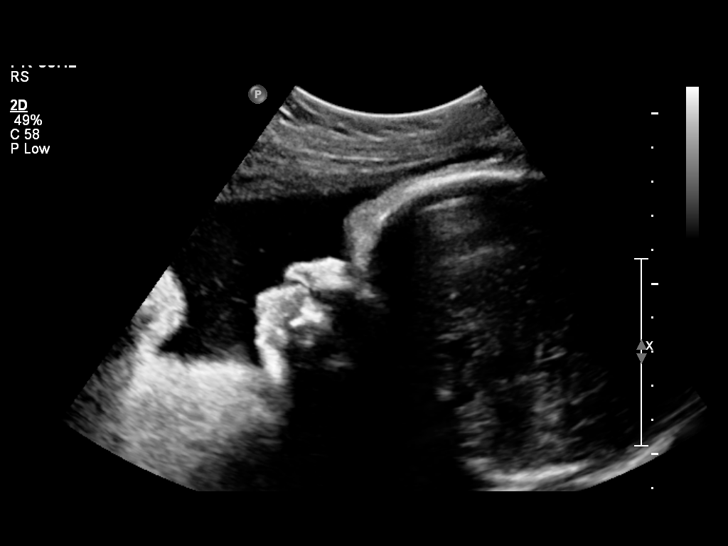
[im 6/47]
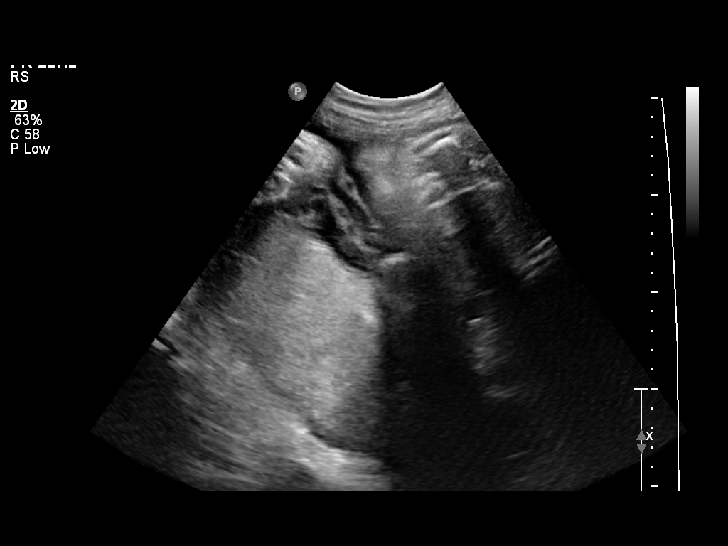
[im 9/47]
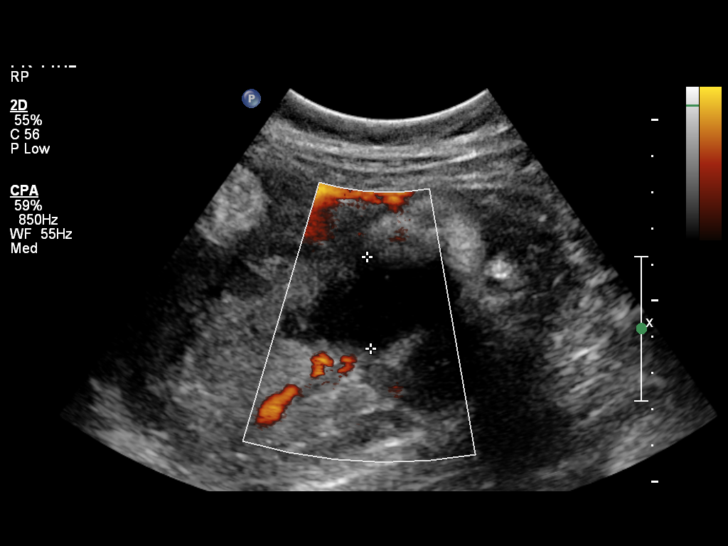
[im 14/47]
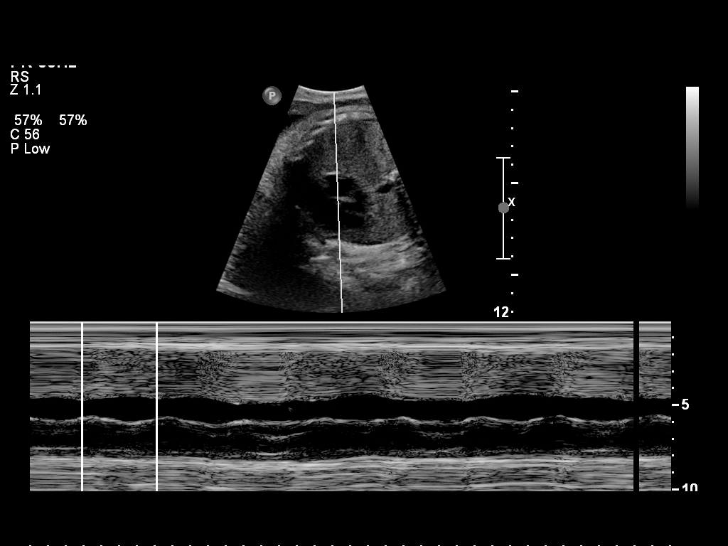
[im 18/47]
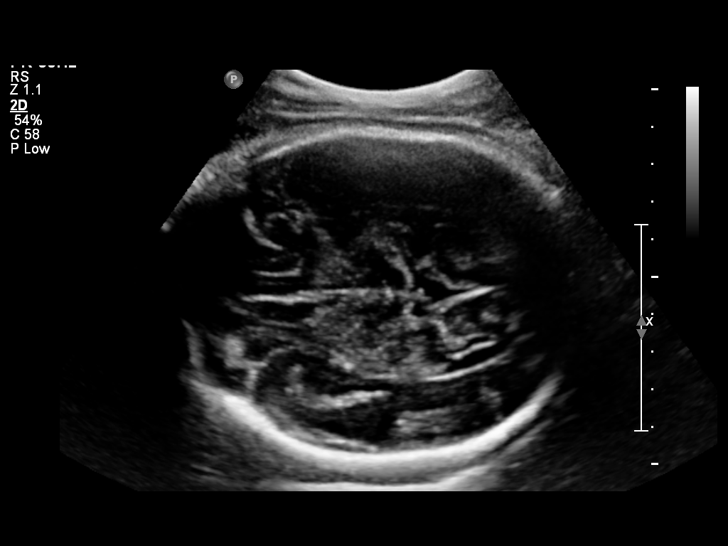
[im 21/47]
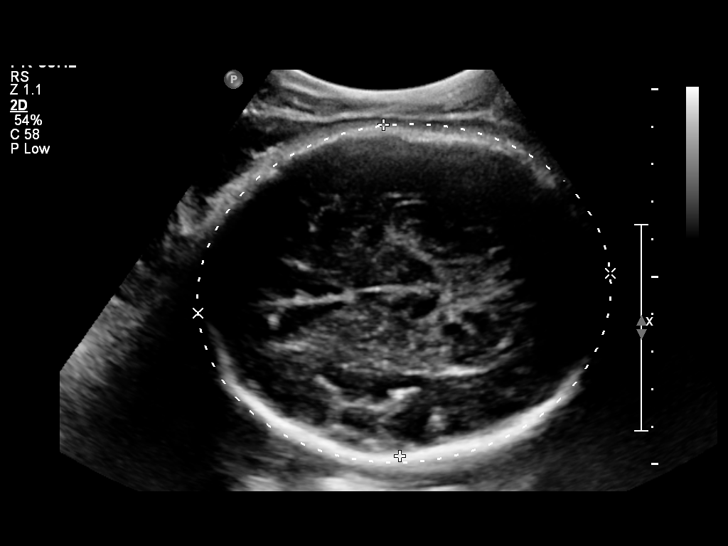
[im 26/47]
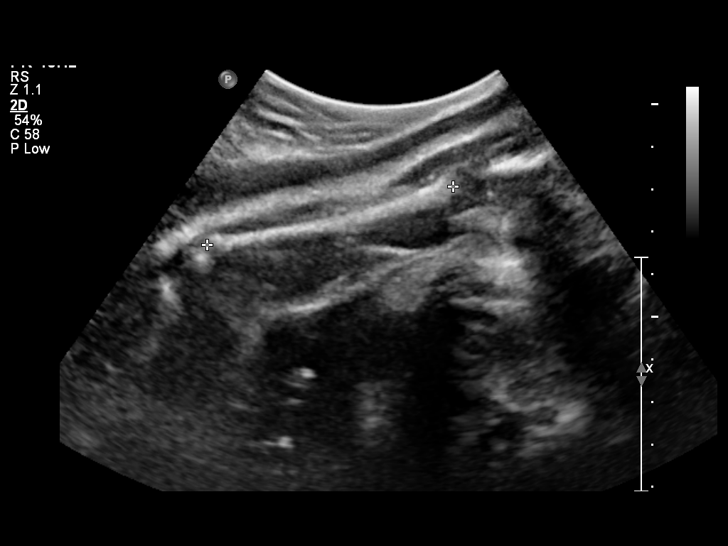
[im 29/47]
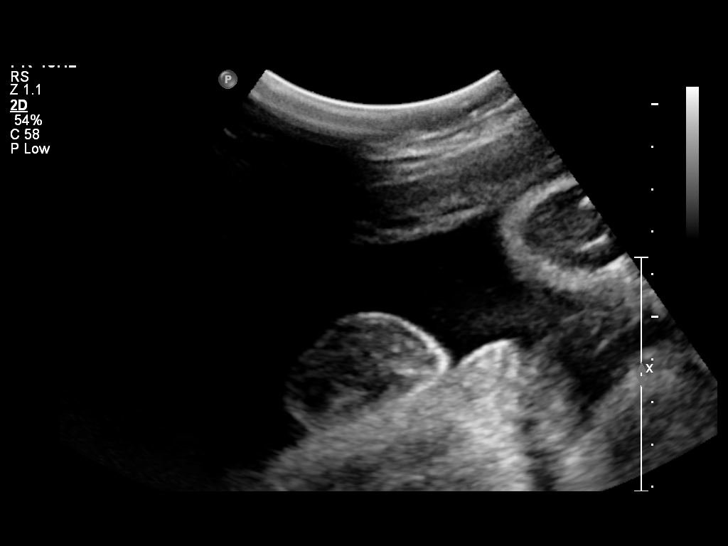
[im 33/47]
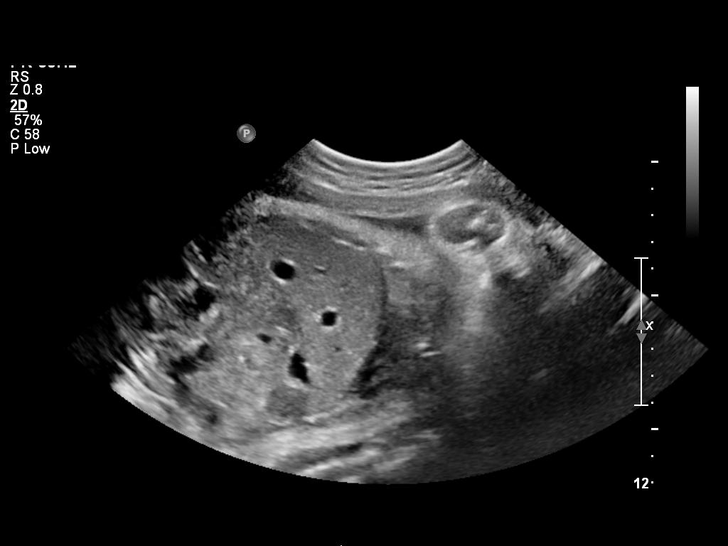
[im 38/47]
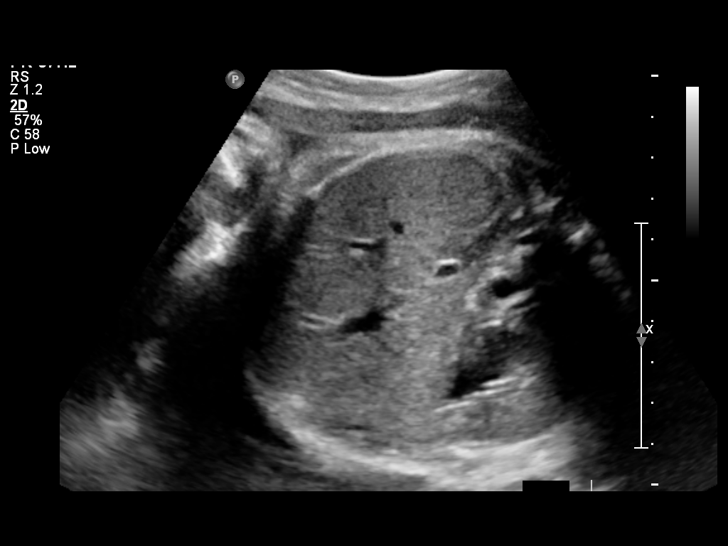
[im 41/47]
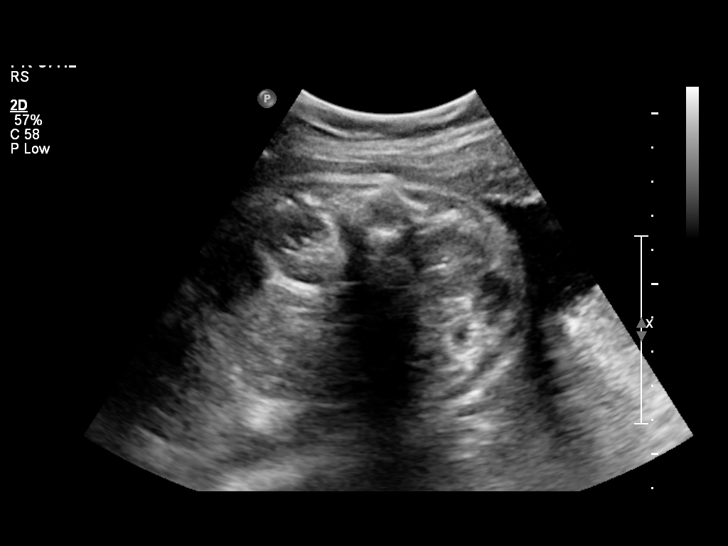
[im 45/47]
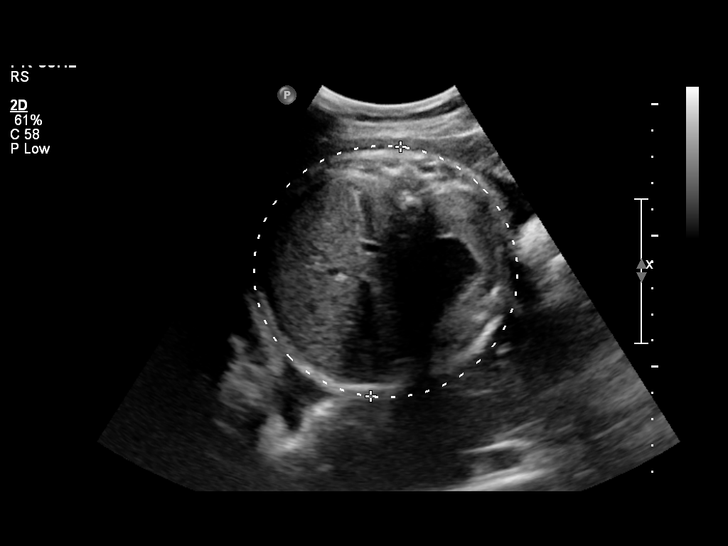

[12 of 28 positions shown; findings below may reference images not displayed]

OBSTETRICS REPORT
                      (Signed Final 11/16/2012 [DATE])

Service(s) Provided

 US OB FOLLOW UP                                       76816.1
Indications

 Size less than dates (Small for gestational [AGE]
 FGR)
Fetal Evaluation

 Num Of Fetuses:    1
 Fetal Heart Rate:  143                         bpm
 Cardiac Activity:  Observed
 Presentation:      Cephalic
 Placenta:          Posterior, above cervical
                    os
 P. Cord            Previously Visualized
 Insertion:

 Amniotic Fluid
 AFI FV:      Subjectively within normal limits
 AFI Sum:     15.86   cm      60   %Tile     Larg Pckt:     5.6  cm
 RUQ:   5.6    cm    RLQ:    2.52   cm    LUQ:   4.59    cm   LLQ:    3.15   cm
Biometry

 BPD:     87.3  mm    G. Age:   35w 2d                CI:        76.94   70 - 86
                                                      FL/HC:      21.8   20.8 -

 HC:     315.2  mm    G. Age:   35w 2d        5  %    HC/AC:      1.03   0.92 -

 AC:       305  mm    G. Age:   34w 4d        8  %    FL/BPD:     78.7   71 - 87
 FL:      68.7  mm    G. Age:   35w 2d       14  %    FL/AC:      22.5   20 - 24
 HUM:     59.7  mm    G. Age:   34w 5d       27  %
 Est. FW:    1471  gm      5 lb 9 oz     23  %
Gestational Age

 LMP:           32w 3d       Date:   04/03/12                 EDD:   01/08/13
 U/S Today:     35w 1d                                        EDD:   12/20/12
 Best:          36w 6d    Det. By:   U/S (08/29/12)           EDD:   12/08/12
Anatomy

 Cranium:          Appears normal         Aortic Arch:      Basic anatomy
                                                            exam per order
 Fetal Cavum:      Appears normal         Ductal Arch:      Basic anatomy
                                                            exam per order
 Ventricles:       Appears normal         Diaphragm:        Previously seen
 Choroid Plexus:   Appears normal         Stomach:          Appears normal
 Cerebellum:       Previously seen        Abdomen:          Appears normal
 Posterior Fossa:  Previously seen        Abdominal Wall:   Previously seen
 Nuchal Fold:      Not applicable (>20    Cord Vessels:     Previously seen
                   wks GA)
 Face:             Orbits and profile     Kidneys:          Appear normal
                   previously seen
 Lips:             Previously seen        Bladder:          Appears normal
 Heart:            Appears normal         Spine:            Previously seen
                   (4CH, axis, and
                   situs)
 RVOT:             Appears normal         Lower             Previously seen
                                          Extremities:
 LVOT:             Appears normal         Upper             Previously seen
                                          Extremities:

 Other:  Male gender.. Heels previously visualized.
Targeted Anatomy

 Fetal Central Nervous System
 Lat. Ventricles:
Cervix Uterus Adnexa

 Cervix:       Not visualized (advanced GA >34 wks)
 Uterus:       No abnormality visualized.
 Cul De Sac:   No free fluid seen.
 Left Ovary:   Within normal limits.
 Right Ovary:  Not visualized.

 Adnexa:     No abnormality visualized.
Impression

 Assigned GA is currently 36w 6d.   Fetal growth curve shows
 interval decline, with EFW currently at  23 %ile and AC at 8
 %ile.  This suggests possibility of developing asymmetric
 IUGR.
 Amniotic fluid within normal limits, with AFI of 15.86 cm.
 No late developing fetal abnormalities seen involving
 visualized anatomy.

 Thank you for sharing in the care of Ms. TUGALUKENI MALLEX with
 questions or concerns.

## 2015-12-29 IMAGING — CR DG WRIST COMPLETE 3+V*R*
2 series · 2 of 2 positions shown · non-contrast
Comparison: None.

CLINICAL DATA: Worsening right wrist pain.  Overuse injury.

EXAM:
RIGHT WRIST - COMPLETE 3+ VIEW

[view not recorded (1 of 2)]
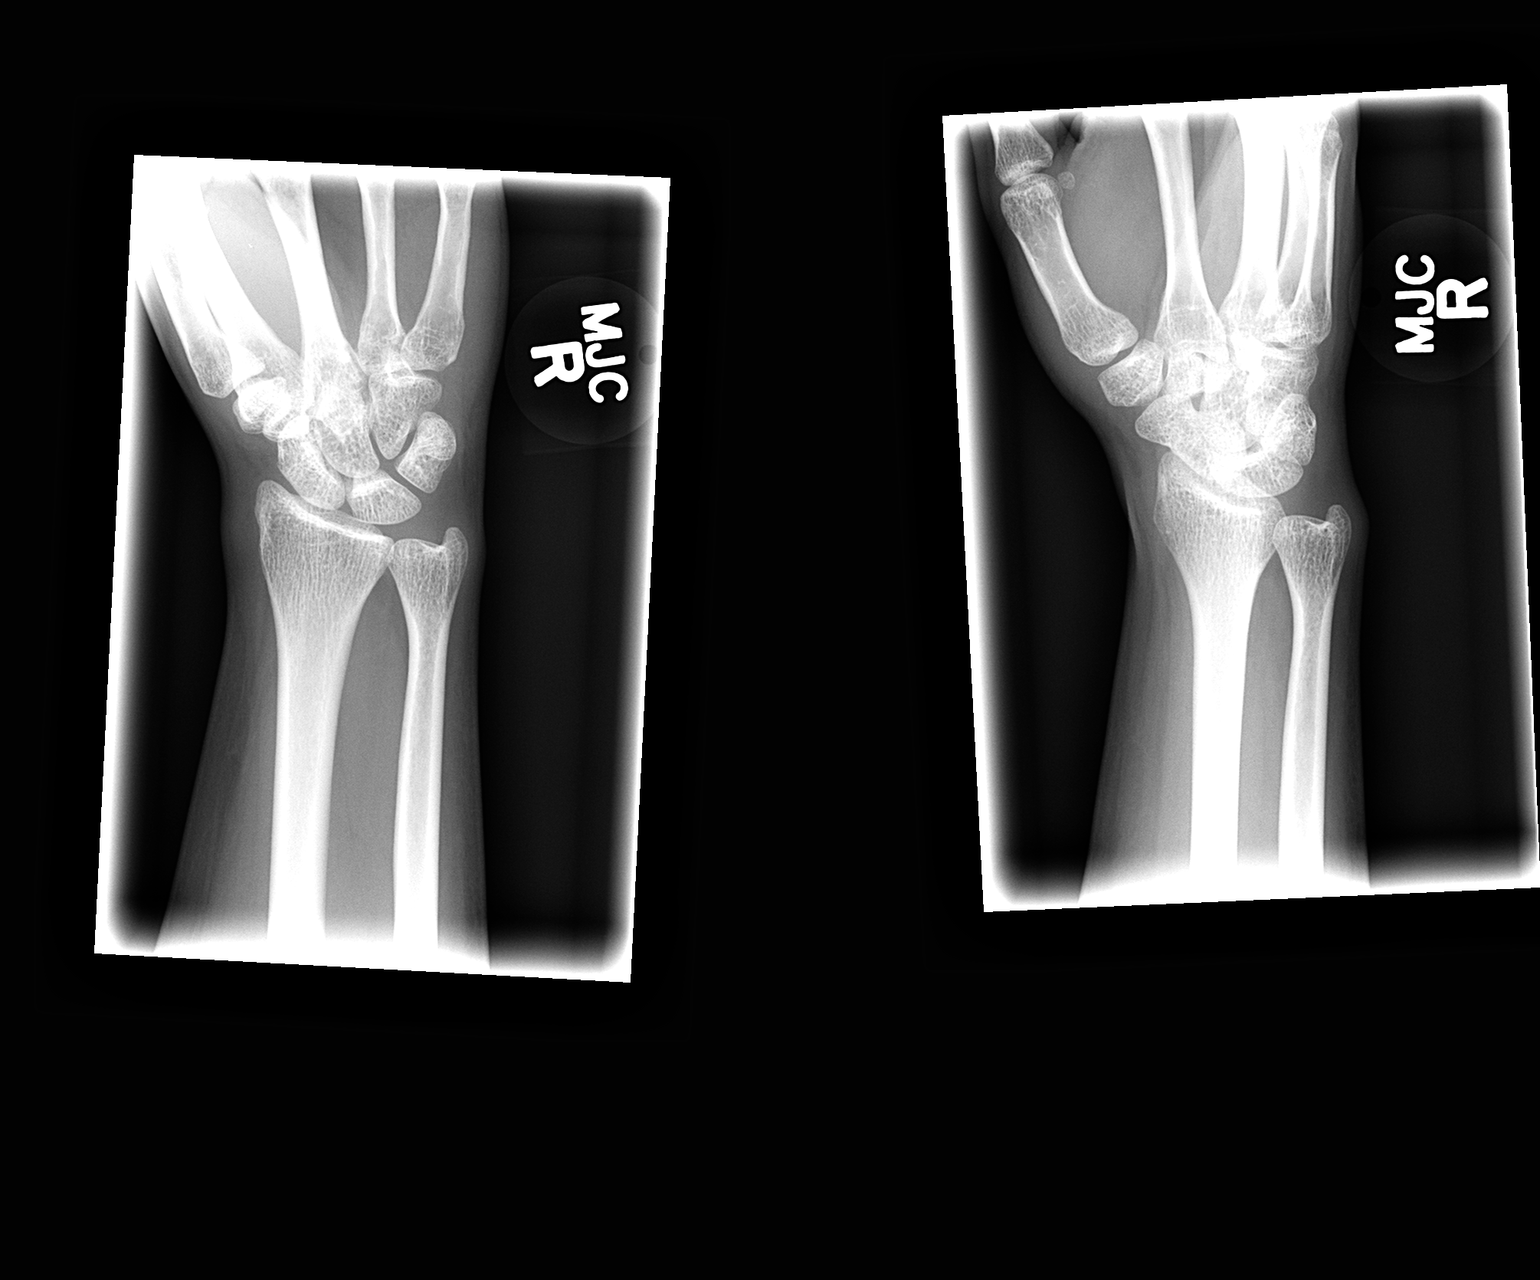

[view not recorded (2 of 2)]
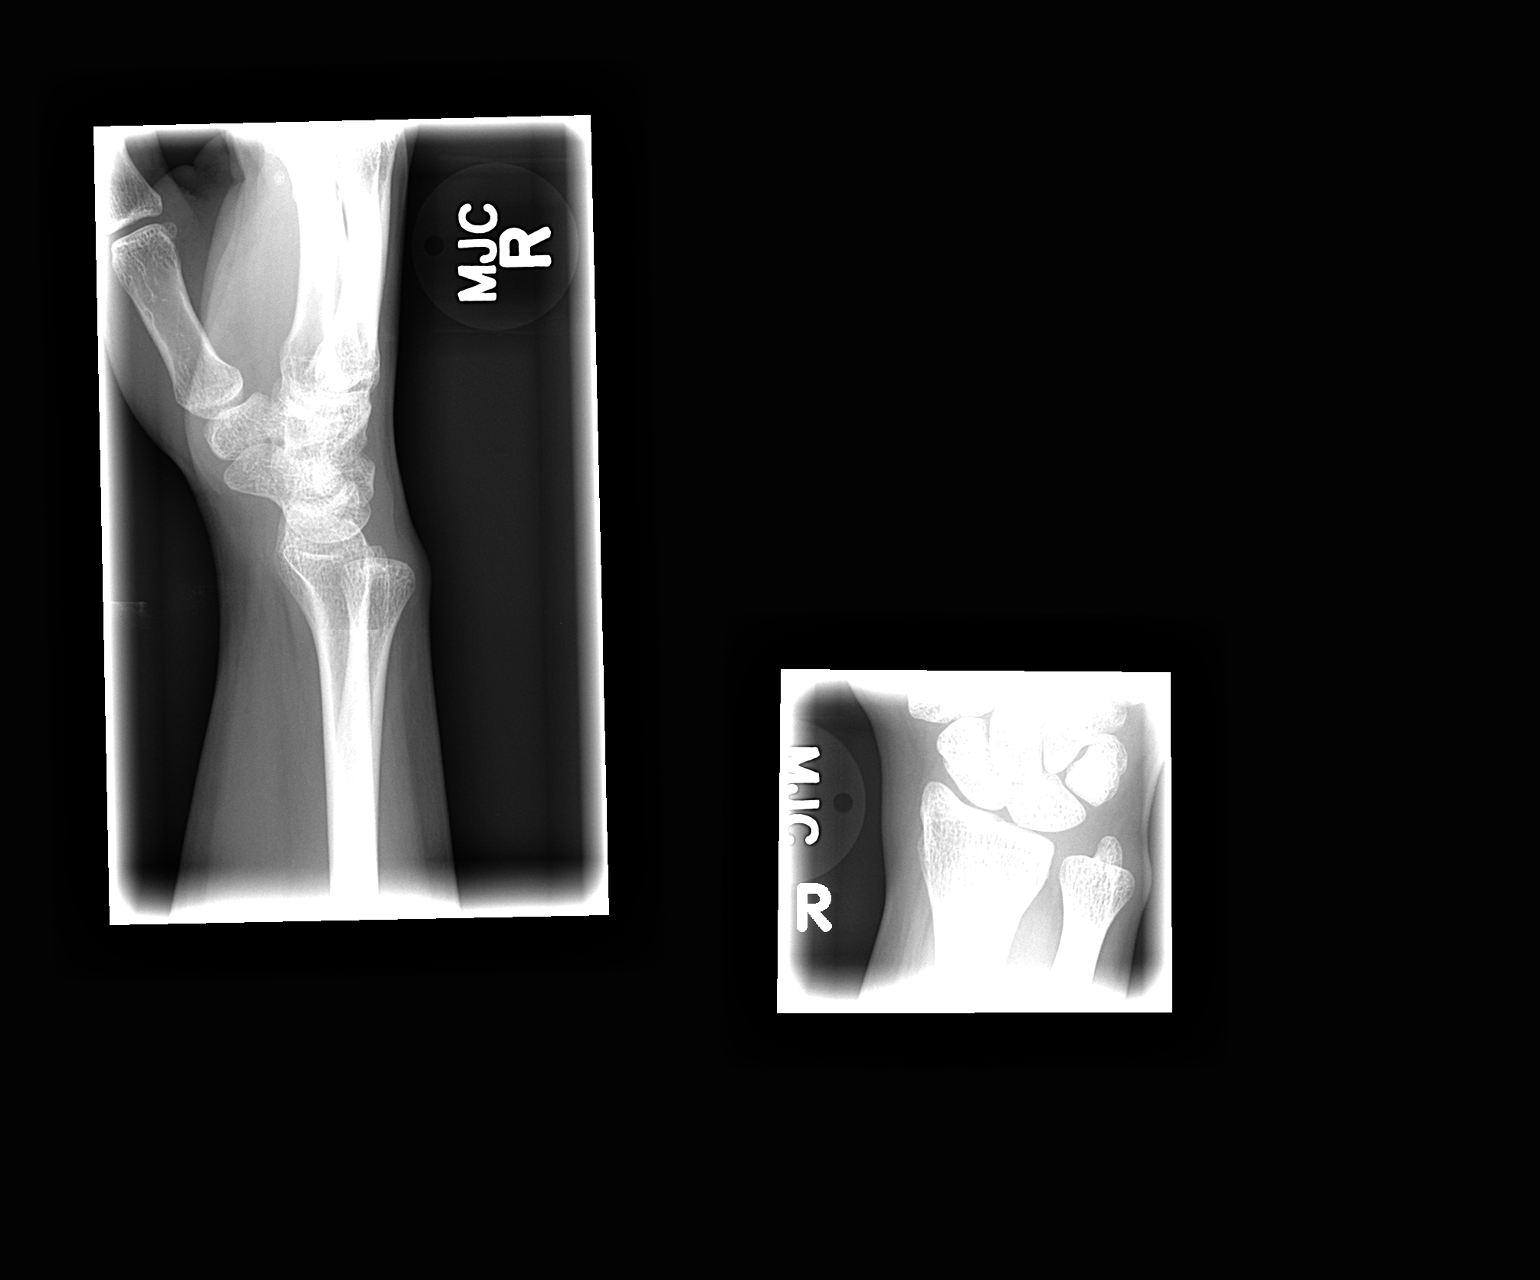

[2 of 2 positions shown; findings below may reference images not displayed]

FINDINGS: There is no evidence of fracture or dislocation. There is no
evidence of arthropathy or other focal bone abnormality. Soft
tissues are unremarkable.
IMPRESSION: Negative.
